# Patient Record
Sex: Female | Born: 1975 | Race: White | Hispanic: No | Marital: Married | Smoking: Current every day smoker
Health system: Southern US, Community
[De-identification: ages and names within clinical notes are randomized; demographics above are authoritative.]

## PROBLEM LIST (undated history)

## (undated) DIAGNOSIS — I10 Essential (primary) hypertension: Secondary | ICD-10-CM

## (undated) DIAGNOSIS — O24419 Gestational diabetes mellitus in pregnancy, unspecified control: Secondary | ICD-10-CM

---

## 2007-11-19 ENCOUNTER — Emergency Department (HOSPITAL_COMMUNITY): Admission: EM | Admit: 2007-11-19 | Discharge: 2007-11-19 | Payer: Self-pay | Admitting: Emergency Medicine

## 2009-09-19 ENCOUNTER — Emergency Department (HOSPITAL_COMMUNITY): Admission: EM | Admit: 2009-09-19 | Discharge: 2009-09-19 | Payer: Self-pay | Admitting: Emergency Medicine

## 2010-09-09 ENCOUNTER — Emergency Department (HOSPITAL_COMMUNITY)
Admission: EM | Admit: 2010-09-09 | Discharge: 2010-09-09 | Disposition: A | Discharge status: Home / Self Care | Payer: Medicaid Other | Attending: Emergency Medicine | Admitting: Emergency Medicine

## 2010-09-09 DIAGNOSIS — O031 Delayed or excessive hemorrhage following incomplete spontaneous abortion: Secondary | ICD-10-CM | POA: Insufficient documentation

## 2010-09-09 LAB — HCG, QUANTITATIVE, PREGNANCY: hCG, Beta Chain, Quant, S: 4575 m[IU]/mL — ABNORMAL HIGH (ref <5)

## 2010-09-09 LAB — CBC
HCT: 37.9 % (ref 36.0–46.0)
Hemoglobin: 13.1 g/dL (ref 12.0–15.0)
MCHC: 34.6 g/dL (ref 30.0–36.0)
MCV: 89.4 fL (ref 78.0–100.0)
RDW: 12.9 % (ref 11.5–15.5)

## 2010-09-09 LAB — DIFFERENTIAL
Basophils Absolute: 0 10*3/uL (ref 0.0–0.1)
Eosinophils Relative: 2 % (ref 0–5)
Lymphocytes Relative: 22 % (ref 12–46)
Lymphs Abs: 1.9 10*3/uL (ref 0.7–4.0)
Monocytes Absolute: 0.5 10*3/uL (ref 0.1–1.0)
Monocytes Relative: 6 % (ref 3–12)
Neutro Abs: 6 10*3/uL (ref 1.7–7.7)

## 2010-09-09 LAB — ABO/RH: ABO/RH(D): A POS

## 2010-09-09 LAB — BASIC METABOLIC PANEL
Chloride: 105 mEq/L (ref 96–112)
Creatinine, Ser: 0.53 mg/dL (ref 0.4–1.2)
Potassium: 3.8 mEq/L (ref 3.5–5.1)

## 2012-06-29 ENCOUNTER — Emergency Department (HOSPITAL_COMMUNITY)
Admission: EM | Admit: 2012-06-29 | Discharge: 2012-06-29 | Disposition: A | Discharge status: Home / Self Care | Payer: Medicaid - Out of State | Attending: Emergency Medicine | Admitting: Emergency Medicine

## 2012-06-29 ENCOUNTER — Encounter (HOSPITAL_COMMUNITY): Payer: Self-pay | Admitting: *Deleted

## 2012-06-29 ENCOUNTER — Emergency Department (HOSPITAL_COMMUNITY): Payer: Medicaid - Out of State

## 2012-06-29 DIAGNOSIS — W19XXXA Unspecified fall, initial encounter: Secondary | ICD-10-CM | POA: Insufficient documentation

## 2012-06-29 DIAGNOSIS — F17200 Nicotine dependence, unspecified, uncomplicated: Secondary | ICD-10-CM | POA: Insufficient documentation

## 2012-06-29 DIAGNOSIS — S7000XA Contusion of unspecified hip, initial encounter: Secondary | ICD-10-CM | POA: Insufficient documentation

## 2012-06-29 DIAGNOSIS — M549 Dorsalgia, unspecified: Secondary | ICD-10-CM

## 2012-06-29 DIAGNOSIS — Y939 Activity, unspecified: Secondary | ICD-10-CM | POA: Insufficient documentation

## 2012-06-29 DIAGNOSIS — IMO0002 Reserved for concepts with insufficient information to code with codable children: Secondary | ICD-10-CM | POA: Insufficient documentation

## 2012-06-29 DIAGNOSIS — I10 Essential (primary) hypertension: Secondary | ICD-10-CM | POA: Insufficient documentation

## 2012-06-29 DIAGNOSIS — Z8632 Personal history of gestational diabetes: Secondary | ICD-10-CM | POA: Insufficient documentation

## 2012-06-29 DIAGNOSIS — Y929 Unspecified place or not applicable: Secondary | ICD-10-CM | POA: Insufficient documentation

## 2012-06-29 HISTORY — DX: Essential (primary) hypertension: I10

## 2012-06-29 HISTORY — DX: Gestational diabetes mellitus in pregnancy, unspecified control: O24.419

## 2012-06-29 MED ORDER — NAPROXEN 500 MG PO TABS: 500.0000 mg | ORAL_TABLET | Freq: Two times a day (BID) | ORAL | Status: DC

## 2012-06-29 MED ORDER — OXYCODONE-ACETAMINOPHEN 5-325 MG PO TABS
1.0000 | ORAL_TABLET | Freq: Once | ORAL | Status: AC
  Administered 2012-06-29: 1 via ORAL
  Filled 2012-06-29: qty 1

## 2012-06-29 MED ORDER — OXYCODONE-ACETAMINOPHEN 5-325 MG PO TABS: 1.0000 | ORAL_TABLET | Freq: Four times a day (QID) | ORAL | Status: AC | PRN

## 2012-06-29 NOTE — ED Notes (Signed)
Pt fell 2 weeks prior, fell again 2 days ago, pt reports pain to tailbone rt hip. Pt ambulated to triage.

## 2012-06-29 NOTE — ED Notes (Signed)
Pt discharged. Pt stable at time of discharge. Medications reviewed pt has no questions regarding discharge at this time. Pt voiced understanding of discharge instructions.  

## 2012-06-29 NOTE — ED Provider Notes (Signed)
History     CSN: [id number]  Arrival date & time 06/29/12  1749   First MD Initiated Contact with Patient 06/29/12 1805      Chief Complaint  Patient presents with  . Hip Pain    rt hip from fall 2 weeks prior  . Tailbone Pain    (Consider location/radiation/quality/duration/timing/severity/associated sxs/prior treatment) Patient is a 36 y.o. female presenting with hip pain. The history is provided by the patient (the pt has fallen twice in the last week on her back.  she has pain in her coccyx and right hip). No language interpreter was used.  Hip Pain This is a new problem. The current episode started more than 2 days ago. The problem occurs constantly. The problem has not changed since onset.Pertinent negatives include no chest pain, no abdominal pain and no headaches. The symptoms are aggravated by bending. Nothing relieves the symptoms. She has tried nothing for the symptoms. The treatment provided no relief.    Past Medical History  Diagnosis Date  . Gestational diabetes   . Hypertension     History reviewed. No pertinent past surgical history.  History reviewed. No pertinent family history.  History  Substance Use Topics  . Smoking status: Current Every Day Smoker -- 1.0 packs/day    Types: Cigarettes  . Smokeless tobacco: Not on file  . Alcohol Use: Yes    OB History    Grav Para Term Preterm Abortions TAB SAB Ect Mult Living                  Review of Systems  Constitutional: Negative for fatigue.  HENT: Negative for congestion, sinus pressure and ear discharge.   Eyes: Negative for discharge.  Respiratory: Negative for cough.   Cardiovascular: Negative for chest pain.  Gastrointestinal: Negative for abdominal pain and diarrhea.  Genitourinary: Negative for frequency and hematuria.  Musculoskeletal: Positive for back pain.  Skin: Negative for rash.  Neurological: Negative for seizures and headaches.  Hematological: Negative.     Psychiatric/Behavioral: Negative for hallucinations.    Allergies  Erythromycin and Morphine and related  Home Medications   Current Outpatient Rx  Name  Route  Sig  Dispense  Refill  . IBUPROFEN 200 MG PO TABS   Oral   Take 600 mg by mouth 2 (two) times daily as needed. For pain         . NAPROXEN 500 MG PO TABS   Oral   Take 1 tablet (500 mg total) by mouth 2 (two) times daily.   30 tablet   0   . OXYCODONE-ACETAMINOPHEN 5-325 MG PO TABS   Oral   Take 1 tablet by mouth every 6 (six) hours as needed for pain.   30 tablet   0     BP 136/94  Pulse 100  Temp 98 F (36.7 C) (Oral)  Resp 20  Ht 5\' 3"  (1.6 m)  Wt 210 lb (95.255 kg)  BMI 37.20 kg/m2  SpO2 99%  LMP 06/27/2012  Physical Exam  Constitutional: She is oriented to person, place, and time. She appears well-developed.  HENT:  Head: Normocephalic and atraumatic.  Eyes: Conjunctivae normal and EOM are normal. No scleral icterus.  Neck: Neck supple. No thyromegaly present.  Cardiovascular: Normal rate and regular rhythm.  Exam reveals no gallop and no friction rub.   No murmur heard. Pulmonary/Chest: No stridor. She has no wheezes. She has no rales. She exhibits no tenderness.  Abdominal: She exhibits no distension. There is  no tenderness. There is no rebound.  Musculoskeletal:       Tender right hip.  Tender coccyx  Lymphadenopathy:    She has no cervical adenopathy.  Neurological: She is oriented to person, place, and time. Coordination normal.  Skin: No rash noted. No erythema.  Psychiatric: She has a normal mood and affect. Her behavior is normal.    ED Course  Procedures (including critical care time)  Labs Reviewed - No data to display Dg Lumbar Spine Complete  06/29/2012  *RADIOLOGY REPORT*  Clinical Data: Fall.  Low back pain.  LUMBAR SPINE - COMPLETE 4+ VIEW  Comparison:  None.  Findings:  There is no evidence of lumbar spine fracture. Alignment is normal.  Intervertebral disc spaces are  maintained.  IMPRESSION: Negative.   Original Report Authenticated By: Myles Rosenthal, M.D.    Dg Sacrum/coccyx  06/29/2012  *RADIOLOGY REPORT*  Clinical Data: Fall.  Sacrococcygeal pain.  SACRUM AND COCCYX - 2+ VIEW  Comparison:  None.  Findings:  There is no evidence of fracture or other focal bone lesions.  IMPRESSION: Negative.   Original Report Authenticated By: Myles Rosenthal, M.D.    Dg Hip Complete Right  06/29/2012  *RADIOLOGY REPORT*  Clinical Data: Fall.  Right hip injury and pain.  RIGHT HIP - COMPLETE 2+ VIEW  Comparison:  None.  Findings:  There is no evidence of hip fracture or dislocation. There is no evidence of arthropathy or other focal bone abnormality.  IMPRESSION: Negative.   Original Report Authenticated By: Myles Rosenthal, M.D.      1. Contusion, hip   2. Back pain       MDM          Benny Lennert, MD 06/29/12 [phone]

## 2013-03-26 ENCOUNTER — Encounter (HOSPITAL_COMMUNITY): Payer: Self-pay | Admitting: Emergency Medicine

## 2013-03-26 ENCOUNTER — Emergency Department (HOSPITAL_COMMUNITY)
Admission: EM | Admit: 2013-03-26 | Discharge: 2013-03-26 | Disposition: A | Discharge status: Home / Self Care | Payer: Medicaid - Out of State | Attending: Emergency Medicine | Admitting: Emergency Medicine

## 2013-03-26 ENCOUNTER — Emergency Department (HOSPITAL_COMMUNITY): Payer: Medicaid - Out of State

## 2013-03-26 DIAGNOSIS — Y939 Activity, unspecified: Secondary | ICD-10-CM | POA: Insufficient documentation

## 2013-03-26 DIAGNOSIS — Z79899 Other long term (current) drug therapy: Secondary | ICD-10-CM | POA: Insufficient documentation

## 2013-03-26 DIAGNOSIS — Z8632 Personal history of gestational diabetes: Secondary | ICD-10-CM | POA: Insufficient documentation

## 2013-03-26 DIAGNOSIS — I10 Essential (primary) hypertension: Secondary | ICD-10-CM | POA: Insufficient documentation

## 2013-03-26 DIAGNOSIS — S8261XA Displaced fracture of lateral malleolus of right fibula, initial encounter for closed fracture: Secondary | ICD-10-CM

## 2013-03-26 DIAGNOSIS — F17200 Nicotine dependence, unspecified, uncomplicated: Secondary | ICD-10-CM | POA: Insufficient documentation

## 2013-03-26 DIAGNOSIS — S8263XA Displaced fracture of lateral malleolus of unspecified fibula, initial encounter for closed fracture: Secondary | ICD-10-CM | POA: Insufficient documentation

## 2013-03-26 DIAGNOSIS — X500XXA Overexertion from strenuous movement or load, initial encounter: Secondary | ICD-10-CM | POA: Insufficient documentation

## 2013-03-26 DIAGNOSIS — Y929 Unspecified place or not applicable: Secondary | ICD-10-CM | POA: Insufficient documentation

## 2013-03-26 MED ORDER — HYDROCODONE-ACETAMINOPHEN 5-325 MG PO TABS
2.0000 | ORAL_TABLET | Freq: Once | ORAL | Status: AC
  Administered 2013-03-26: 2 via ORAL
  Filled 2013-03-26: qty 2

## 2013-03-26 MED ORDER — HYDROCODONE-ACETAMINOPHEN 5-325 MG PO TABS: 2.0000 | ORAL_TABLET | ORAL | Status: DC | PRN

## 2013-03-26 NOTE — ED Notes (Signed)
Tripped an fell last night and right foot rolled. States can not put any weight on it and toes get numb. Nad. Slight swelling noted.

## 2013-03-26 NOTE — ED Notes (Signed)
Pt seen and evaluated by EDP for initial assessment. 

## 2013-03-26 NOTE — ED Notes (Signed)
Pt does not want crutches, has a brand new pair in her car.

## 2013-03-26 NOTE — ED Provider Notes (Signed)
Scribed for [name], the patient was seen in room APFT24/APFT24. This chart was scribed by [name], ED scribe. Patient's care was started at 1542   CSN: [id number]     Arrival date & time 03/26/13  1520 History   First MD Initiated Contact with Patient 03/26/13 1540     Chief Complaint  Patient presents with  . Fall   (Consider location/radiation/quality/duration/timing/severity/associated sxs/prior Treatment) The history is provided by the patient.   HPI Comments: Leslie Bates is a 37 y.o. female who presents to the Emergency Department complaining of worsening constant right ankle pain onset 2330 last night after a mechanical fall causing her to twist right ankle. Reports associated mild numbness in toes, and swelling lateral ankle. Describes pain as sharp and non-radiating. Reports pain is exacerbated by touch, movement, and weight bearing. Denies any alleviating factors. Denies associated neck injury, abdominal pain, head injury, LOC, back pain, and other related injuries.  Past Medical History  Diagnosis Date  . Gestational diabetes   . Hypertension    History reviewed. No pertinent past surgical history. History reviewed. No pertinent family history. History  Substance Use Topics  . Smoking status: Current Every Day Smoker -- 1.00 packs/day    Types: Cigarettes  . Smokeless tobacco: Not on file  . Alcohol Use: Yes   OB History   Grav Para Term Preterm Abortions TAB SAB Ect Mult Living                 Review of Systems  Musculoskeletal: Positive for arthralgias.  Skin: Negative for wound.  All other systems reviewed and are negative.   A complete 10 system review of systems was obtained and all systems are negative except as noted in the HPI and PMH.    Allergies  Erythromycin and Morphine and related  Home Medications   Current Outpatient Rx  Name  Route  Sig  Dispense  Refill  . ibuprofen (ADVIL,MOTRIN) 800 MG tablet   Oral  Take 800 mg by mouth every 8 (eight) hours as needed for pain.         . metFORMIN (GLUCOPHAGE) 500 MG tablet   Oral   Take 500 mg by mouth 2 (two) times daily.         Marland Kitchen HYDROcodone-acetaminophen (NORCO/VICODIN) 5-325 MG per tablet   Oral   Take 2 tablets by mouth every 4 (four) hours as needed for pain.   20 tablet   0    BP 132/82  Pulse 112  Temp(Src) 97.8 F (36.6 C) (Oral)  Resp 16  SpO2 95%  LMP 03/26/2013 Physical Exam  Nursing note and vitals reviewed. Constitutional: She is oriented to person, place, and time. She appears well-developed and well-nourished. No distress.  HENT:  Head: Normocephalic and atraumatic.  Mouth/Throat: Oropharynx is clear and moist. No oropharyngeal exudate.  Eyes: Conjunctivae and EOM are normal. No scleral icterus.  Neck: Normal range of motion. Neck supple. No tracheal deviation present.  Cardiovascular: Normal rate, regular rhythm, intact distal pulses and normal pulses.   No murmur heard. Pulses:      Dorsalis pedis pulses are 2+ on the right side.       Posterior tibial pulses are 2+ on the right side.  Pulmonary/Chest: Effort normal and breath sounds normal. No respiratory distress.  Musculoskeletal: She exhibits edema and tenderness.       Right ankle: She exhibits decreased range of motion (limited flexion and extension secondary to pain ) and  swelling. She exhibits no ecchymosis. Tenderness. Lateral malleolus tenderness found. No medial malleolus and no head of 5th metatarsal tenderness found. Achilles tendon normal. Achilles tendon exhibits no defect.  Right ankle:  small joint effusion. TTP lateral malleolus Achilles intact  +2 PT DP pulse  No proximal fibula tenderness  Neurological: She is alert and oriented to person, place, and time. No sensory deficit.  Normal sensation to light touch   Skin: Skin is warm and dry.  Psychiatric: She has a normal mood and affect. Her behavior is normal.    ED Course  Procedures  (including critical care time) Medications  HYDROcodone-acetaminophen (NORCO/VICODIN) 5-325 MG per tablet 2 tablet (2 tablets Oral Given 03/26/13 1608)    Labs Review Labs Reviewed  GLUCOSE, CAPILLARY - Abnormal; Notable for the following:    Glucose-Capillary 219 (*)    All other components within normal limits   Imaging Review Dg Ankle Complete Right  03/26/2013   CLINICAL DATA:  Initial encounter for left foot and ankle injury due to a fall.  EXAM: RIGHT ANKLE - COMPLETE 3+ VIEW  COMPARISON:  None.  FINDINGS: Tiny avulsion fracture arising from the tip of the lateral malleolus. No other fractures. Ankle mortise intact with well preserved joint space. No visible joint effusion. Dorsal and lateral soft tissue swelling.  IMPRESSION: Tiny avulsion fracture arising from the tip of the lateral malleolus. No other fractures.   Electronically Signed   By: Hulan Saas   On: 03/26/2013 15:45   Dg Foot Complete Right  03/26/2013   CLINICAL DATA:  Initial encounter for left foot and ankle injury due to a fall.  EXAM: RIGHT FOOT COMPLETE - 3+ VIEW  COMPARISON:  None.  FINDINGS: No evidence of acute fracture or dislocation. Well preserved joint spaces. Well preserved bone mineral density. No intrinsic osseous abnormalities. Dorsal soft tissue swelling.  IMPRESSION: No osseous abnormality.   Electronically Signed   By: Hulan Saas   On: 03/26/2013 15:43    MDM   1. Avulsion fracture of lateral malleolus, right, closed, initial encounter    Right lateral ankle pain after mechanical fall last night stepping into a ditch. Did not hit head or lose consciousness. Difficulty bearing weight with lateral right ankle pain.  Skin intact. Distal pulses intact. Tenderness over lateral malleolus. Achilles intact. No proximal fibula tenderness  Avulsion fracture of lateral malleolar tip on Xray. Patient instructed on elevation, ice, pain control.  Cam walker, crutchers, followup with Ortho.  I personally  performed the services described in this documentation, which was scribed in my presence. The recorded information has been reviewed and is accurate.   [name], MD 03/26/13 [phone]

## 2013-06-16 ENCOUNTER — Emergency Department (HOSPITAL_COMMUNITY)
Admission: EM | Admit: 2013-06-16 | Discharge: 2013-06-16 | Disposition: A | Discharge status: Home / Self Care | Payer: Medicaid - Out of State | Attending: Emergency Medicine | Admitting: Emergency Medicine

## 2013-06-16 ENCOUNTER — Encounter (HOSPITAL_COMMUNITY): Payer: Self-pay | Admitting: Emergency Medicine

## 2013-06-16 DIAGNOSIS — Z8632 Personal history of gestational diabetes: Secondary | ICD-10-CM | POA: Insufficient documentation

## 2013-06-16 DIAGNOSIS — R110 Nausea: Secondary | ICD-10-CM | POA: Insufficient documentation

## 2013-06-16 DIAGNOSIS — Z79899 Other long term (current) drug therapy: Secondary | ICD-10-CM | POA: Insufficient documentation

## 2013-06-16 DIAGNOSIS — R109 Unspecified abdominal pain: Secondary | ICD-10-CM

## 2013-06-16 DIAGNOSIS — F17200 Nicotine dependence, unspecified, uncomplicated: Secondary | ICD-10-CM | POA: Insufficient documentation

## 2013-06-16 DIAGNOSIS — R1012 Left upper quadrant pain: Secondary | ICD-10-CM | POA: Insufficient documentation

## 2013-06-16 DIAGNOSIS — R141 Gas pain: Secondary | ICD-10-CM | POA: Insufficient documentation

## 2013-06-16 DIAGNOSIS — R142 Eructation: Secondary | ICD-10-CM | POA: Insufficient documentation

## 2013-06-16 DIAGNOSIS — Z3202 Encounter for pregnancy test, result negative: Secondary | ICD-10-CM | POA: Insufficient documentation

## 2013-06-16 DIAGNOSIS — I10 Essential (primary) hypertension: Secondary | ICD-10-CM | POA: Insufficient documentation

## 2013-06-16 LAB — CBC WITH DIFFERENTIAL/PLATELET
Basophils Absolute: 0 10*3/uL (ref 0.0–0.1)
Basophils Relative: 0 % (ref 0–1)
Eosinophils Absolute: 0.2 10*3/uL (ref 0.0–0.7)
Lymphs Abs: 2.3 10*3/uL (ref 0.7–4.0)
MCH: 31.9 pg (ref 26.0–34.0)
MCHC: 34.6 g/dL (ref 30.0–36.0)
Neutrophils Relative %: 72 % (ref 43–77)
Platelets: 180 10*3/uL (ref 150–400)
RBC: 4.74 MIL/uL (ref 3.87–5.11)
RDW: 13.1 % (ref 11.5–15.5)

## 2013-06-16 LAB — URINALYSIS, ROUTINE W REFLEX MICROSCOPIC
Bilirubin Urine: NEGATIVE
Hgb urine dipstick: NEGATIVE
Leukocytes, UA: NEGATIVE
Nitrite: NEGATIVE
Specific Gravity, Urine: 1.02 (ref 1.005–1.030)
pH: 7 (ref 5.0–8.0)

## 2013-06-16 LAB — BASIC METABOLIC PANEL
GFR calc Af Amer: >90 mL/min (ref >90)
GFR calc non Af Amer: >90 mL/min (ref >90)
Potassium: 3.7 mEq/L (ref 3.5–5.1)
Sodium: 136 mEq/L (ref 135–145)

## 2013-06-16 LAB — HEPATIC FUNCTION PANEL
AST: 48 U/L — ABNORMAL HIGH (ref 0–37)
Albumin: 3.4 g/dL — ABNORMAL LOW (ref 3.5–5.2)
Total Protein: 7.4 g/dL (ref 6.0–8.3)

## 2013-06-16 LAB — LIPASE, BLOOD: Lipase: 23 U/L (ref 11–59)

## 2013-06-16 LAB — POCT PREGNANCY, URINE: Preg Test, Ur: NEGATIVE

## 2013-06-16 MED ORDER — HYDROCODONE-ACETAMINOPHEN 5-325 MG PO TABS
2.0000 | ORAL_TABLET | Freq: Once | ORAL | Status: AC
  Administered 2013-06-16: 2 via ORAL
  Filled 2013-06-16: qty 2

## 2013-06-16 MED ORDER — FAMOTIDINE 20 MG PO TABS
20.0000 mg | ORAL_TABLET | Freq: Once | ORAL | Status: AC
  Administered 2013-06-16: 20 mg via ORAL
  Filled 2013-06-16: qty 1

## 2013-06-16 MED ORDER — FENTANYL CITRATE 0.05 MG/ML IJ SOLN
100.0000 ug | Freq: Once | INTRAMUSCULAR | Status: AC
  Administered 2013-06-16: 100 ug via INTRAMUSCULAR
  Filled 2013-06-16: qty 2

## 2013-06-16 MED ORDER — PROMETHAZINE HCL 25 MG PO TABS: 25.0000 mg | ORAL_TABLET | Freq: Four times a day (QID) | ORAL | Status: DC | PRN

## 2013-06-16 MED ORDER — HYDROCODONE-ACETAMINOPHEN 5-325 MG PO TABS: ORAL_TABLET | ORAL | Status: DC

## 2013-06-16 MED ORDER — PROMETHAZINE HCL 12.5 MG PO TABS
12.5000 mg | ORAL_TABLET | Freq: Once | ORAL | Status: AC
  Administered 2013-06-16: 12.5 mg via ORAL
  Filled 2013-06-16: qty 1

## 2013-06-16 MED ORDER — ONDANSETRON 4 MG PO TBDP
4.0000 mg | ORAL_TABLET | Freq: Once | ORAL | Status: AC
  Administered 2013-06-16: 4 mg via ORAL
  Filled 2013-06-16: qty 1

## 2013-06-16 MED ORDER — RANITIDINE HCL 150 MG PO TABS: 150.0000 mg | ORAL_TABLET | Freq: Two times a day (BID) | ORAL | Status: AC

## 2013-06-16 MED ORDER — OMEPRAZOLE 20 MG PO CPDR: 20.0000 mg | DELAYED_RELEASE_CAPSULE | Freq: Every day | ORAL | Status: DC

## 2013-06-16 NOTE — ED Provider Notes (Signed)
CSN: [id number]     Arrival date & time 06/16/13  1817 History   First MD Initiated Contact with Patient 06/16/13 1846     Chief Complaint  Patient presents with  . Abdominal Pain   (Consider location/radiation/quality/duration/timing/severity/associated sxs/prior Treatment) HPI Comments: Patient states she's been having upper abdomen pain for about 3 days. She's had nausea, and cramping, but no vomiting. The patient also states that she's been having a" discomfort" over the bladder area". The patient states she's not been paying a lot of attention to what she's been eating particularly over the last week, but did notice that approximately 2-3 hours after eating a sausage biscuit today the pain seemed to have gotten progressively worse. The patient states her bowels have been semisolid. She's not had any problems with constipation recently. She denies any blood in the stool, or blood in the urine.  Patient is a 37 y.o. female presenting with abdominal pain. The history is provided by the patient.  Abdominal Pain Pain location:  LUQ Pain quality: cramping and sharp   Pain radiates to:  RUQ Pain severity:  Moderate Onset quality:  Gradual Duration:  3 days Timing:  Intermittent Progression:  Worsening Chronicity:  New Context: alcohol use   Context: not eating, not recent travel and not trauma   Relieved by:  Nothing Worsened by:  Movement and deep breathing Ineffective treatments:  None tried Associated symptoms: belching and nausea   Associated symptoms: no anorexia, no chest pain, no constipation, no cough, no dysuria, no hematuria, no shortness of breath, no vaginal bleeding, no vaginal discharge and no vomiting   Risk factors: obesity     Past Medical History  Diagnosis Date  . Gestational diabetes   . Hypertension    Past Surgical History  Procedure Laterality Date  . Cesarean section     History reviewed. No pertinent family history. History  Substance Use Topics  .  Smoking status: Current Every Day Smoker -- 1.00 packs/day    Types: Cigarettes  . Smokeless tobacco: Not on file  . Alcohol Use: Yes   OB History   Grav Para Term Preterm Abortions TAB SAB Ect Mult Living                 Review of Systems  Constitutional: Negative for activity change.       All ROS Neg except as noted in HPI  HENT: Negative for nosebleeds.   Eyes: Negative for photophobia and discharge.  Respiratory: Negative for cough, shortness of breath and wheezing.   Cardiovascular: Negative for chest pain and palpitations.  Gastrointestinal: Positive for nausea and abdominal pain. Negative for vomiting, constipation, blood in stool and anorexia.  Genitourinary: Negative for dysuria, frequency, hematuria, vaginal bleeding and vaginal discharge.  Musculoskeletal: Negative for arthralgias, back pain and neck pain.  Skin: Negative.   Neurological: Negative for dizziness, seizures and speech difficulty.  Psychiatric/Behavioral: Negative for hallucinations and confusion.    Allergies  Erythromycin and Morphine and related  Home Medications   Current Outpatient Rx  Name  Route  Sig  Dispense  Refill  . HYDROcodone-acetaminophen (NORCO/VICODIN) 5-325 MG per tablet   Oral   Take 2 tablets by mouth every 4 (four) hours as needed for pain.   20 tablet   0   . ibuprofen (ADVIL,MOTRIN) 800 MG tablet   Oral   Take 800 mg by mouth every 8 (eight) hours as needed for pain.         . metFORMIN (GLUCOPHAGE)  500 MG tablet   Oral   Take 500 mg by mouth 2 (two) times daily.          BP 132/74  Pulse 95  Temp(Src) 98.5 F (36.9 C) (Oral)  Resp 18  Ht 5\' 3"  (1.6 m)  Wt 210 lb (95.255 kg)  BMI 37.21 kg/m2  SpO2 96%  LMP 05/16/2013 Physical Exam  Nursing note and vitals reviewed. Constitutional: She is oriented to person, place, and time. She appears well-developed and well-nourished.  Non-toxic appearance.  HENT:  Head: Normocephalic.  Right Ear: Tympanic membrane and  external ear normal.  Left Ear: Tympanic membrane and external ear normal.  Eyes: EOM and lids are normal. Pupils are equal, round, and reactive to light.  Neck: Normal range of motion. Neck supple. Carotid bruit is not present.  Cardiovascular: Normal rate, regular rhythm, normal heart sounds, intact distal pulses and normal pulses.   Pulmonary/Chest: Breath sounds normal. No respiratory distress.  Abdominal: Soft. Bowel sounds are normal. She exhibits no mass. There is no tenderness. There is no rebound and no guarding.  There is pain to palpation of the right upper quadrant in the epigastric area. There is pain to palpation over the suprapubic area. There is no rebound tenderness. And no guarding. No mass appreciated. No organomegaly appreciated. Chaperone present during the examination.  Musculoskeletal: Normal range of motion.  Lymphadenopathy:       Head (right side): No submandibular adenopathy present.       Head (left side): No submandibular adenopathy present.    She has no cervical adenopathy.  Neurological: She is alert and oriented to person, place, and time. She has normal strength. No cranial nerve deficit or sensory deficit.  Skin: Skin is warm and dry.  Psychiatric: She has a normal mood and affect. Her speech is normal.    ED Course  Procedures (including critical care time) Labs Review Labs Reviewed - No data to display Imaging Review No results found.  EKG Interpretation   None       MDM  No diagnosis found. **I have reviewed nursing notes, vital signs, and all appropriate lab and imaging results for this patient.*  Urinalysis reveals a cloudy specimen that was otherwise within normal limits. Lipase was normal at 23. Urine pregnancy test is negative. Complete blood count shows a slight elevation in the white blood cells of 10,700, hemoglobin 15.1, otherwise well within normal limits. The basic metabolic panel was well within normal limits. The hepatic function  reveals the AST to be elevated at 48, the ALT elevated at 97, and the total bili, rule of low at 0.2.  Patient's discomfort improved with intramuscular fentanyl and ODT Zofran. Patient scheduled for right upper quadrant ultrasound on tomorrow morning December 19. If this is positive, the patient has been given a referral to Dr. Lovell Sheehan. If this is negative, the patient is asked to see the GI specialist for additional evaluation. A prescription for promethazine 25 mg, Zantac 150 mg, prostate 20 mg, and Norco 5 mg bid given to the patient.  Kathie Dike, PA-C 06/17/13 [phone]

## 2013-06-16 NOTE — ED Notes (Signed)
Upper abd pain for 3 days, nausea, no vomiting.  Sl diarrhea.  Headache,  No fever.

## 2013-06-17 ENCOUNTER — Ambulatory Visit (HOSPITAL_COMMUNITY)
Admit: 2013-06-17 | Discharge: 2013-06-17 | Disposition: A | Discharge status: Home / Self Care | Payer: Medicaid - Out of State | Attending: Emergency Medicine | Admitting: Emergency Medicine

## 2013-06-17 DIAGNOSIS — K7689 Other specified diseases of liver: Secondary | ICD-10-CM | POA: Insufficient documentation

## 2013-06-17 DIAGNOSIS — R109 Unspecified abdominal pain: Secondary | ICD-10-CM | POA: Insufficient documentation

## 2013-06-18 NOTE — ED Provider Notes (Signed)
Medical screening examination/treatment/procedure(s) were performed by non-physician practitioner and as supervising physician I was immediately available for consultation/collaboration.   Nelia Shi, MD 06/18/13 1155

## 2015-07-16 ENCOUNTER — Encounter (HOSPITAL_COMMUNITY): Payer: Self-pay | Admitting: Emergency Medicine

## 2015-07-16 ENCOUNTER — Emergency Department (HOSPITAL_COMMUNITY)
Admission: EM | Admit: 2015-07-16 | Discharge: 2015-07-16 | Disposition: A | Discharge status: Home / Self Care | Payer: Medicaid - Out of State | Attending: Emergency Medicine | Admitting: Emergency Medicine

## 2015-07-16 ENCOUNTER — Emergency Department (HOSPITAL_COMMUNITY): Payer: Medicaid - Out of State

## 2015-07-16 DIAGNOSIS — J069 Acute upper respiratory infection, unspecified: Secondary | ICD-10-CM | POA: Insufficient documentation

## 2015-07-16 DIAGNOSIS — F17210 Nicotine dependence, cigarettes, uncomplicated: Secondary | ICD-10-CM | POA: Insufficient documentation

## 2015-07-16 DIAGNOSIS — Z8632 Personal history of gestational diabetes: Secondary | ICD-10-CM | POA: Insufficient documentation

## 2015-07-16 DIAGNOSIS — J209 Acute bronchitis, unspecified: Secondary | ICD-10-CM | POA: Insufficient documentation

## 2015-07-16 DIAGNOSIS — R000 Tachycardia, unspecified: Secondary | ICD-10-CM | POA: Insufficient documentation

## 2015-07-16 DIAGNOSIS — Z7984 Long term (current) use of oral hypoglycemic drugs: Secondary | ICD-10-CM | POA: Insufficient documentation

## 2015-07-16 DIAGNOSIS — Z79899 Other long term (current) drug therapy: Secondary | ICD-10-CM | POA: Insufficient documentation

## 2015-07-16 DIAGNOSIS — I10 Essential (primary) hypertension: Secondary | ICD-10-CM | POA: Insufficient documentation

## 2015-07-16 MED ORDER — DOXYCYCLINE HYCLATE 100 MG PO CAPS: 100.0000 mg | ORAL_CAPSULE | Freq: Two times a day (BID) | ORAL | Status: DC

## 2015-07-16 MED ORDER — PREDNISONE 50 MG PO TABS
60.0000 mg | ORAL_TABLET | Freq: Once | ORAL | Status: AC
  Administered 2015-07-16: 60 mg via ORAL
  Filled 2015-07-16: qty 1

## 2015-07-16 MED ORDER — DOXYCYCLINE HYCLATE 100 MG PO TABS
100.0000 mg | ORAL_TABLET | Freq: Once | ORAL | Status: AC
  Administered 2015-07-16: 100 mg via ORAL
  Filled 2015-07-16: qty 1

## 2015-07-16 MED ORDER — HYDROCODONE-HOMATROPINE 5-1.5 MG/5ML PO SYRP
5.0000 mL | ORAL_SOLUTION | Freq: Four times a day (QID) | ORAL | Status: DC | PRN
Start: 2015-07-16 — End: 2017-05-29

## 2015-07-16 MED ORDER — LORATADINE-PSEUDOEPHEDRINE ER 5-120 MG PO TB12: 1.0000 | ORAL_TABLET | Freq: Two times a day (BID) | ORAL | Status: DC

## 2015-07-16 MED ORDER — ONDANSETRON HCL 4 MG PO TABS
4.0000 mg | ORAL_TABLET | Freq: Once | ORAL | Status: AC
  Administered 2015-07-16: 4 mg via ORAL
  Filled 2015-07-16: qty 1

## 2015-07-16 MED ORDER — HYDROCOD POLST-CPM POLST ER 10-8 MG/5ML PO SUER
5.0000 mL | Freq: Once | ORAL | Status: AC
  Administered 2015-07-16: 5 mL via ORAL
  Filled 2015-07-16: qty 5

## 2015-07-16 NOTE — ED Notes (Signed)
Patient complaining of cough and shortness of breath x 2 weeks.

## 2015-07-16 NOTE — ED Provider Notes (Signed)
CSN: [id number]     Arrival date & time 07/16/15  1924 History   First MD Initiated Contact with Patient 07/16/15 2112     Chief Complaint  Patient presents with  . Cough  . Shortness of Breath     (Consider location/radiation/quality/duration/timing/severity/associated sxs/prior Treatment) HPI Comments: Patient is a 40 year old female who presents to the emergency department with a complaint of cough and shortness of breath.  The patient states that beginning approximately January 1 she began having problems with cough and shortness of breath. She states that she had been treated in the past with an albuterol inhaler, and has been using the albuterol. She states in spite of the albuterol she still feels as though she cannot get much area in. Patient also complains of chest wall soreness from somewhat shallow coughing. She does not recall any high fever. She is not coughing up any blood. She has not had any high fevers that she is aware of. She has had body aches. She presents now for assistance with this issue.  The history is provided by the patient.    Past Medical History  Diagnosis Date  . Gestational diabetes   . Hypertension    Past Surgical History  Procedure Laterality Date  . Cesarean section     History reviewed. No pertinent family history. Social History  Substance Use Topics  . Smoking status: Current Every Day Smoker -- 1.00 packs/day    Types: Cigarettes  . Smokeless tobacco: None  . Alcohol Use: No   OB History    No data available     Review of Systems  HENT: Positive for congestion.   Respiratory: Positive for cough and shortness of breath.   All other systems reviewed and are negative.     Allergies  Erythromycin and Morphine and related  Home Medications   Prior to Admission medications   Medication Sig Start Date End Date Taking? Authorizing Provider  HYDROcodone-acetaminophen (NORCO/VICODIN) 5-325 MG per tablet 1 or 2 po q4h prn pain  06/16/13   Ivery Quale, PA-C  ibuprofen (ADVIL,MOTRIN) 200 MG tablet Take 800 mg by mouth every 6 (six) hours as needed.    Historical Provider, MD  lisinopril-hydrochlorothiazide (PRINZIDE,ZESTORETIC) 20-12.5 MG per tablet Take 1 tablet by mouth daily.    Historical Provider, MD  metFORMIN (GLUCOPHAGE) 500 MG tablet Take 500 mg by mouth daily.     Historical Provider, MD  omeprazole (PRILOSEC) 20 MG capsule Take 1 capsule (20 mg total) by mouth daily. 06/16/13   Ivery Quale, PA-C  promethazine (PHENERGAN) 25 MG tablet Take 1 tablet (25 mg total) by mouth every 6 (six) hours as needed for nausea or vomiting. 06/16/13   Ivery Quale, PA-C  ranitidine (ZANTAC) 150 MG tablet Take 1 tablet (150 mg total) by mouth 2 (two) times daily. 06/16/13   Ivery Quale, PA-C   BP 132/72 mmHg  Pulse 112  Temp(Src) 98.2 F (36.8 C) (Oral)  Resp 20  Ht 5\' 3"  (1.6 m)  Wt 99.791 kg  BMI 38.98 kg/m2  SpO2 99%  LMP 06/25/2015 Physical Exam  Constitutional: She is oriented to person, place, and time. She appears well-developed and well-nourished.  Non-toxic appearance.  HENT:  Head: Normocephalic.  Right Ear: Tympanic membrane and external ear normal.  Left Ear: Tympanic membrane and external ear normal.  Nasal congestion present.  Eyes: EOM and lids are normal. Pupils are equal, round, and reactive to light.  Neck: Normal range of motion. Neck supple. Carotid bruit is  not present.  Cardiovascular: Normal rate, regular rhythm, normal heart sounds, intact distal pulses and normal pulses.   Pulmonary/Chest: Breath sounds normal. No respiratory distress.  Course breath sounds present with few scattered rhonchi. Symmetrical rise and fall of the chest. Patient speaks in complete sentences.  Abdominal: Soft. Bowel sounds are normal. There is no tenderness. There is no guarding.  Musculoskeletal: Normal range of motion.  Lymphadenopathy:       Head (right side): No submandibular adenopathy present.        Head (left side): No submandibular adenopathy present.    She has no cervical adenopathy.  Neurological: She is alert and oriented to person, place, and time. She has normal strength. No cranial nerve deficit or sensory deficit.  Skin: Skin is warm and dry. No rash noted.  Psychiatric: She has a normal mood and affect. Her speech is normal.  Nursing note and vitals reviewed.   ED Course  Procedures (including critical care time) Labs Review Labs Reviewed - No data to display  Imaging Review Dg Chest 2 View  07/16/2015  CLINICAL DATA:  40 year old female with history productive cough and shortness of breath for the past 2 weeks. EXAM: CHEST  2 VIEW COMPARISON:  No priors. FINDINGS: Diffuse peribronchial cuffing. Lung volumes are normal. No consolidative airspace disease. No pleural effusions. No pneumothorax. No pulmonary nodule or mass noted. Pulmonary vasculature and the cardiomediastinal silhouette are within normal limits. IMPRESSION: 1. Diffuse peribronchial cuffing, suggestive of an acute bronchitis. Electronically Signed   By: Trudie Reedaniel  Entrikin M.D.   On: 07/16/2015 20:31   I have personally reviewed and evaluated these images and lab results as part of my medical decision-making.   EKG Interpretation None      MDM  Patient has some tachycardia of 112 beats a minute, otherwise vital signs are well within normal limits. The pulse oximetry is 99% on room air.  Chest x-ray shows diffuse peribronchial cuffing, suggestive of acute bronchitis. The patient is a current smoker. The plan at this time is for the patient be treated with doxycycline, Decadron, Claritin-D, and Hycodan. The patient is to follow-up with her primary physician in the week for recheck of her symptoms.    Final diagnoses:  None    *I have reviewed nursing notes, vital signs, and all appropriate lab and imaging results for this patient.**    Ivery QualeHobson Twylla Arceneaux, PA-C 07/16/15 2133  Eber HongBrian Miller, MD 07/21/15  [phone]

## 2015-07-16 NOTE — Discharge Instructions (Signed)
Your x-ray and your exam are consistent with bronchitis. Please increase fluids. Please use your mask until symptoms have resolved. Please stop smoking. Please use Claritin-D, doxycycline, and albuterol daily. Use Hycodan for cough every 6 hours if needed. Please see your Medicaid access physician for recheck in the next 7-10 days. Acute Bronchitis Bronchitis is when the airways that extend from the windpipe into the lungs get red, puffy, and painful (inflamed). Bronchitis often causes thick spit (mucus) to develop. This leads to a cough. A cough is the most common symptom of bronchitis. In acute bronchitis, the condition usually begins suddenly and goes away over time (usually in 2 weeks). Smoking, allergies, and asthma can make bronchitis worse. Repeated episodes of bronchitis may cause more lung problems. HOME CARE  Rest.  Drink enough fluids to keep your pee (urine) clear or pale yellow (unless you need to limit fluids as told by your doctor).  Only take over-the-counter or prescription medicines as told by your doctor.  Avoid smoking and secondhand smoke. These can make bronchitis worse. If you are a smoker, think about using nicotine gum or skin patches. Quitting smoking will help your lungs heal faster.  Reduce the chance of getting bronchitis again by:  Washing your hands often.  Avoiding people with cold symptoms.  Trying not to touch your hands to your mouth, nose, or eyes.  Follow up with your doctor as told. GET HELP IF: Your symptoms do not improve after 1 week of treatment. Symptoms include:  Cough.  Fever.  Coughing up thick spit.  Body aches.  Chest congestion.  Chills.  Shortness of breath.  Sore throat. GET HELP RIGHT AWAY IF:   You have an increased fever.  You have chills.  You have severe shortness of breath.  You have bloody thick spit (sputum).  You throw up (vomit) often.  You lose too much body fluid (dehydration).  You have a severe  headache.  You faint. MAKE SURE YOU:   Understand these instructions.  Will watch your condition.  Will get help right away if you are not doing well or get worse.   This information is not intended to replace advice given to you by your health care provider. Make sure you discuss any questions you have with your health care provider.   Document Released: 12/03/2007 Document Revised: 02/16/2013 Document Reviewed: 12/07/2012 Elsevier Interactive Patient Education [employer]2016 Elsevier Inc.

## 2016-10-15 ENCOUNTER — Encounter (HOSPITAL_COMMUNITY): Payer: Self-pay | Admitting: *Deleted

## 2016-10-15 ENCOUNTER — Emergency Department (HOSPITAL_COMMUNITY)
Admission: EM | Admit: 2016-10-15 | Discharge: 2016-10-16 | Disposition: A | Discharge status: Home / Self Care | Payer: Self-pay | Attending: Emergency Medicine | Admitting: Emergency Medicine

## 2016-10-15 ENCOUNTER — Emergency Department (HOSPITAL_COMMUNITY): Payer: Self-pay

## 2016-10-15 DIAGNOSIS — I10 Essential (primary) hypertension: Secondary | ICD-10-CM | POA: Insufficient documentation

## 2016-10-15 DIAGNOSIS — Z79899 Other long term (current) drug therapy: Secondary | ICD-10-CM | POA: Insufficient documentation

## 2016-10-15 DIAGNOSIS — E119 Type 2 diabetes mellitus without complications: Secondary | ICD-10-CM | POA: Insufficient documentation

## 2016-10-15 DIAGNOSIS — L0390 Cellulitis, unspecified: Secondary | ICD-10-CM

## 2016-10-15 DIAGNOSIS — Z7984 Long term (current) use of oral hypoglycemic drugs: Secondary | ICD-10-CM | POA: Insufficient documentation

## 2016-10-15 DIAGNOSIS — N3289 Other specified disorders of bladder: Secondary | ICD-10-CM | POA: Insufficient documentation

## 2016-10-15 DIAGNOSIS — F17210 Nicotine dependence, cigarettes, uncomplicated: Secondary | ICD-10-CM | POA: Insufficient documentation

## 2016-10-15 DIAGNOSIS — L03317 Cellulitis of buttock: Secondary | ICD-10-CM | POA: Insufficient documentation

## 2016-10-15 LAB — BASIC METABOLIC PANEL
ANION GAP: 8 (ref 5–15)
BUN: 6 mg/dL (ref 6–20)
CHLORIDE: 96 mmol/L — AB (ref 101–111)
CO2: 26 mmol/L (ref 22–32)
Calcium: 8.6 mg/dL — ABNORMAL LOW (ref 8.9–10.3)
Creatinine, Ser: 0.42 mg/dL — ABNORMAL LOW (ref 0.44–1.00)
GFR calc Af Amer: >60 mL/min (ref >60)
GFR calc non Af Amer: >60 mL/min (ref >60)
GLUCOSE: 324 mg/dL — AB (ref 65–99)
Potassium: 3.3 mmol/L — ABNORMAL LOW (ref 3.5–5.1)
Sodium: 130 mmol/L — ABNORMAL LOW (ref 135–145)

## 2016-10-15 LAB — CBC WITH DIFFERENTIAL/PLATELET
BASOS ABS: 0 10*3/uL (ref 0.0–0.1)
Basophils Relative: 0 %
Eosinophils Absolute: 0.1 10*3/uL (ref 0.0–0.7)
Eosinophils Relative: 1 %
HCT: 42.6 % (ref 36.0–46.0)
Hemoglobin: 15 g/dL (ref 12.0–15.0)
LYMPHS PCT: 21 %
Lymphs Abs: 2.6 10*3/uL (ref 0.7–4.0)
MCH: 30.7 pg (ref 26.0–34.0)
MCHC: 35.2 g/dL (ref 30.0–36.0)
MCV: 87.3 fL (ref 78.0–100.0)
Monocytes Absolute: 0.6 10*3/uL (ref 0.1–1.0)
Monocytes Relative: 5 %
NEUTROS ABS: 9.2 10*3/uL — AB (ref 1.7–7.7)
Neutrophils Relative %: 73 %
Platelets: 171 10*3/uL (ref 150–400)
RBC: 4.88 MIL/uL (ref 3.87–5.11)
RDW: 13 % (ref 11.5–15.5)
WBC: 12.6 10*3/uL — ABNORMAL HIGH (ref 4.0–10.5)

## 2016-10-15 LAB — I-STAT BETA HCG BLOOD, ED (MC, WL, AP ONLY): I-stat hCG, quantitative: <5 m[IU]/mL (ref <5)

## 2016-10-15 MED ORDER — IOPAMIDOL (ISOVUE-300) INJECTION 61%
INTRAVENOUS | Status: AC
  Administered 2016-10-15: 30 mL
  Filled 2016-10-15: qty 30

## 2016-10-15 MED ORDER — IOPAMIDOL (ISOVUE-300) INJECTION 61%
100.0000 mL | Freq: Once | INTRAVENOUS | Status: AC | PRN
  Administered 2016-10-15: 100 mL via INTRAVENOUS

## 2016-10-15 MED ORDER — FENTANYL CITRATE (PF) 100 MCG/2ML IJ SOLN
100.0000 ug | Freq: Once | INTRAMUSCULAR | Status: AC
  Administered 2016-10-15: 100 ug via INTRAVENOUS
  Filled 2016-10-15: qty 2

## 2016-10-15 NOTE — ED Notes (Signed)
Pt states that she has ran out of her blood pressure medication, has appointment to get prescription refilled,

## 2016-10-15 NOTE — ED Triage Notes (Signed)
Pt c/o ?abscess to right buttock region that started a few days ago,

## 2016-10-15 NOTE — ED Provider Notes (Signed)
[facility]EM[facility] DEPT Provider Note   CSN: [id number] Arrival date & time: 10/15/16  1946     History   Chief Complaint Chief Complaint  Patient presents with  . Abscess    HPI Leslie Bates is a 41 y.o. female who presents with 1 week of worsening pain, swelling and redness to her right buttocks. Patient reports that initially area started very small but it has grown significantly over the past week. She reports that pain is worsened with sitting down or movement. She has tried ibuprofen, Epsom salts and warm baths with no relief. She has tried to drain the area but denies any drainage from the site. She denies any injuries or trauma. She has a history of Diabetes and states that she is prescribed Metformin but has not taken it in 3 months. She denies any fevers, CP, SOB, any other concerns.   The history is provided by the patient.    Past Medical History:  Diagnosis Date  . Gestational diabetes   . Hypertension     There are no active problems to display for this patient.   Past Surgical History:  Procedure Laterality Date  . CESAREAN SECTION      OB History    No data available       Home Medications    Prior to Admission medications   Medication Sig Start Date End Date Taking? Authorizing Provider  doxycycline (VIBRAMYCIN) 100 MG capsule Take 1 capsule (100 mg total) by mouth 2 (two) times daily. 07/16/15   Ivery Quale, PA-C  HYDROcodone-acetaminophen (NORCO/VICODIN) 5-325 MG tablet Take 1-2 tablets by mouth every 4 (four) hours as needed. 10/16/16   Maxwell Caul, PA-C  HYDROcodone-homatropine (HYCODAN) 5-1.5 MG/5ML syrup Take 5 mLs by mouth every 6 (six) hours as needed. 07/16/15   Ivery Quale, PA-C  ibuprofen (ADVIL,MOTRIN) 200 MG tablet Take 800 mg by mouth every 6 (six) hours as needed.    Historical Provider, MD  lisinopril-hydrochlorothiazide (PRINZIDE,ZESTORETIC) 20-12.5 MG per tablet Take 1 tablet by mouth daily.    Historical Provider, MD    loratadine-pseudoephedrine (CLARITIN-D 12 HOUR) 5-120 MG tablet Take 1 tablet by mouth 2 (two) times daily. 07/16/15   Ivery Quale, PA-C  metFORMIN (GLUCOPHAGE) 500 MG tablet Take 500 mg by mouth daily.     Historical Provider, MD  omeprazole (PRILOSEC) 20 MG capsule Take 1 capsule (20 mg total) by mouth daily. 06/16/13   Ivery Quale, PA-C  promethazine (PHENERGAN) 25 MG tablet Take 1 tablet (25 mg total) by mouth every 6 (six) hours as needed for nausea or vomiting. 06/16/13   Ivery Quale, PA-C  ranitidine (ZANTAC) 150 MG tablet Take 1 tablet (150 mg total) by mouth 2 (two) times daily. 06/16/13   Ivery Quale, PA-C  sulfamethoxazole-trimethoprim (BACTRIM DS,SEPTRA DS) 800-160 MG tablet Take 1 tablet by mouth 2 (two) times daily. 10/16/16 10/26/16  Maxwell Caul, PA-C    Family History No family history on file.  Social History Social History  Substance Use Topics  . Smoking status: Current Every Day Smoker    Packs/day: 1.00    Types: Cigarettes  . Smokeless tobacco: Never Used  . Alcohol use No     Allergies   Erythromycin and Morphine and related   Review of Systems Review of Systems  Constitutional: Negative for fever.  Respiratory: Negative for cough and shortness of breath.   Cardiovascular: Negative for chest pain.  Gastrointestinal: Negative for abdominal pain, diarrhea, nausea and vomiting.  Genitourinary: Negative  for dysuria and hematuria.  Musculoskeletal: Negative for back pain.  Skin: Positive for color change.       +Swelling and redness to right buttocks  Neurological: Negative for headaches.  All other systems reviewed and are negative.    Physical Exam Updated Vital Signs BP 129/72 (BP Location: Right Arm)   Pulse 100   Temp 98.5 F (36.9 C) (Oral)   Resp 19   Ht  (1.6 m)   Wt 83.9 kg   LMP 09/30/2016   SpO2 97%   BMI 32.77 kg/m   Physical Exam  Constitutional: She appears well-developed and well-nourished.  HENT:  Head:  Normocephalic and atraumatic.  Eyes: Conjunctivae and EOM are normal. Right eye exhibits no discharge. Left eye exhibits no discharge. No scleral icterus.  Cardiovascular: Normal rate, regular rhythm and intact distal pulses.   Pulmonary/Chest: Effort normal and breath sounds normal.  Genitourinary: Rectum normal. Rectal exam shows no mass and no tenderness.  Genitourinary Comments: The exam was performed with a chaperone present. No abscess, no area of fluctuance or induration noted on rectal exam.   Musculoskeletal: She exhibits no deformity.  Neurological: She is alert.  Skin: Skin is warm and dry.  Large area of erythema and  induration to the medial aspect of the gluteus that extends to the gluteal fold. No anal or rectal involvement noted. No fluctuance noted. No active drainage.  Psychiatric: She has a normal mood and affect. Her speech is normal and behavior is normal.     ED Treatments / Results  Labs (all labs ordered are listed, but only abnormal results are displayed) Labs Reviewed  BASIC METABOLIC PANEL - Abnormal; Notable for the following:       Result Value   Sodium 130 (*)    Potassium 3.3 (*)    Chloride 96 (*)    Glucose, Bld 324 (*)    Creatinine, Ser 0.42 (*)    Calcium 8.6 (*)    All other components within normal limits  CBC WITH DIFFERENTIAL/PLATELET - Abnormal; Notable for the following:    WBC 12.6 (*)    Neutro Abs 9.2 (*)    All other components within normal limits  I-STAT BETA HCG BLOOD, ED ([facility], [facility], [facility] ONLY)    EKG  EKG Interpretation None       Radiology Ct Abdomen Pelvis W Contrast  Result Date: 10/15/2016 CLINICAL DATA:  Right perirectal abscess. EXAM: CT ABDOMEN AND PELVIS WITH CONTRAST TECHNIQUE: Multidetector CT imaging of the abdomen and pelvis was performed using the standard protocol following bolus administration of intravenous contrast. CONTRAST:  100 cc Isovue-300 IV COMPARISON:  None. FINDINGS: Lower chest: Thin-walled cyst in the  right middle lobe. No pleural fluid or consolidation. Hepatobiliary: Decreased hepatic density consistent with steatosis. No focal lesion. Gallbladder is decompressed, no calcified gallstone. No biliary dilatation. Pancreas: No ductal dilatation or inflammation. Spleen: Normal in size without focal abnormality. Splenule at the hilum. Adrenals/Urinary Tract: No adrenal nodule. No hydronephrosis or perinephric edema. Symmetric renal enhancement and excretion on delayed phase imaging. No focal renal lesion. Urinary bladder is distended without wall thickening. Stomach/Bowel: Asymmetric skin thickening and soft tissue induration about the right box in the gluteal cleft extends to the anorectal junction. No discrete fluid collection/abscess. Inflammation tracks distally along the gluteal crease and right perineal and buttock fat. No soft tissue air. No definite associated rectal inflammation. No bowel wall thickening, inflammation or abnormal distention. The appendix is normal. Stomach physiologically distended. Vascular/Lymphatic: No  significant vascular findings are present. No enlarged abdominal or pelvic lymph nodes. Small bilateral pelvic nodes are not enlarged. Reproductive: Uterus and bilateral adnexa are unremarkable. Prominent ovarian veins but no increased adnexal or periuterine vascularity. Other: No free air, free fluid, or intra-abdominal fluid collection. Musculoskeletal: There are no acute or suspicious osseous abnormalities. IMPRESSION: Right perirectal soft tissue induration/inflammation tracking about the gluteal crease and inferiorly but the subcutaneous tissues of the right buttock. No discrete fluid collection/abscess or soft tissue air. Electronically Signed   By: Rubye Oaks M.D.   On: 10/15/2016 23:58    Procedures Procedures (including critical care time)  Medications Ordered in ED Medications  fentaNYL (SUBLIMAZE) injection 100 mcg (100 mcg Intravenous Given 10/15/16 2240)    iopamidol (ISOVUE-300) 61 % injection (30 mLs  Contrast Given 10/15/16 2329)  iopamidol (ISOVUE-300) 61 % injection 100 mL (100 mLs Intravenous Contrast Given 10/15/16 2329)  sulfamethoxazole-trimethoprim (BACTRIM DS,SEPTRA DS) 800-160 MG per tablet 1 tablet (1 tablet Oral Given 10/16/16 0056)     Initial Impression / Assessment and Plan / ED Course  I have reviewed the triage vital signs and the nursing notes.  Pertinent labs & imaging results that were available during my care of the patient were reviewed by me and considered in my medical decision making (see chart for details).     41 y.o. F with PMH/o HTN, poorly controlled DM who presents with 1 week of progressively worsening redness and swelling to the right buttocks. Consider cellulitis vs abscess, though low suspicion given lack of fluctuance and abscess felt on rectal exam. Given large extent of the area of erythema and induration, will obtain CT abd/pelvis to eval for abscess. Will also check BMP and CBC. Analgesics given for pain relief.   Labs and imaging reviewed. CBC with slight leukocytosis. BMP with hyperglycemia. CT abd/pelvis shows no discrete fluid collection or evidence of abscess. Likely cellulitis. Given extent of cellulitis and poorly controlled DM, patient would likely benefit from admission to the hospital for IV antibiotics.   Discussed labs and imaging with patient. Explained that while there is no abscess seen on CT, given the extent of the cellulitis, being admitted for a short course of IV antibiotics would be beneficial. We discussed the option of being admitted at length, but patient does not want to be admitted due to the fact that her daughter is with her and has no way of getting home and other family concerns. We discussed the alternatives of receiving an IV dose of antibiotics while in the department, followed by PO antibiotics vs discharging her home with PO antibiotics. Explained that regardless of option, I  want her to follow-up in the ED in 2 days for wound recheck. Strongly encouraged receiving a dose of IV antibiotics while here to help combat any potential complications. Patient agrees to receive IV antibiotics here. IV Vancomycin ordered.   Informed by tech that patient has changed her mind regarding receiving IV antibiotics and would like to be discharged at this time with PO antibiotics. Discussed with patient at length. Given her family concerns and her daughter who is with her, she does not want to stay and receive IV antibiotics. Patient is afebrile, non-toxic appearing, sitting comfortably on examination table and is stable for discharge at this time. Will plan to send her home on a course of Bactrim DS. Instructed her to return to the ED in 2 days for re-evaluation or sooner if any complications. Return precautions discussed. Patient expresses understanding and  agreement to plan.     Final Clinical Impressions(s) / ED Diagnoses   Final diagnoses:  Cellulitis, unspecified cellulitis site    New Prescriptions Discharge Medication List as of 10/16/2016 12:46 AM    START taking these medications   Details  sulfamethoxazole-trimethoprim (BACTRIM DS,SEPTRA DS) 800-160 MG tablet Take 1 tablet by mouth 2 (two) times daily., Starting Thu 10/16/2016, Until Sun 10/26/2016, Print         Maxwell Caul, PA-C 10/16/16 1333    Bethann Berkshire, MD 10/21/16 2122

## 2016-10-16 MED ORDER — VANCOMYCIN HCL IN DEXTROSE 1-5 GM/200ML-% IV SOLN: 1000.0000 mg | Freq: Once | INTRAVENOUS | Status: DC

## 2016-10-16 MED ORDER — HYDROCODONE-ACETAMINOPHEN 5-325 MG PO TABS: 1.0000 | ORAL_TABLET | ORAL | 0 refills | Status: DC | PRN

## 2016-10-16 MED ORDER — SULFAMETHOXAZOLE-TRIMETHOPRIM 800-160 MG PO TABS: 1.0000 | ORAL_TABLET | Freq: Two times a day (BID) | ORAL | 0 refills | Status: AC

## 2016-10-16 MED ORDER — SULFAMETHOXAZOLE-TRIMETHOPRIM 800-160 MG PO TABS
1.0000 | ORAL_TABLET | Freq: Once | ORAL | Status: AC
  Administered 2016-10-16: 1 via ORAL
  Filled 2016-10-16: qty 1

## 2016-10-16 NOTE — Discharge Instructions (Signed)
Take the Bactrim as directed.  Return to the Emergency Department in 2 days for re-evaluation of the wound.   Return sooner for any high fever, worsening pain, nausea/vomiting or any other concerns.   Follow-up with your primary care in 1 week to get back on your metformin.   If you do not have a primary care doctor you see regularly, please you the list below. Please call them to arrange for follow-up.    No Primary Care Doctor Call [facility]  [phone] Other agencies that provide inexpensive medical care    [facility]  [phone]    [facility]  [phone]    [facility]  [phone]    [facility]  [phone]    [facility]  [phone]    [facility]  [phone]

## 2016-10-18 ENCOUNTER — Emergency Department (HOSPITAL_COMMUNITY)
Admission: EM | Admit: 2016-10-18 | Discharge: 2016-10-18 | Disposition: A | Discharge status: Home / Self Care | Payer: Medicaid - Out of State | Attending: Emergency Medicine | Admitting: Emergency Medicine

## 2016-10-18 ENCOUNTER — Encounter (HOSPITAL_COMMUNITY): Payer: Self-pay | Admitting: Emergency Medicine

## 2016-10-18 DIAGNOSIS — F17210 Nicotine dependence, cigarettes, uncomplicated: Secondary | ICD-10-CM | POA: Insufficient documentation

## 2016-10-18 DIAGNOSIS — I10 Essential (primary) hypertension: Secondary | ICD-10-CM | POA: Insufficient documentation

## 2016-10-18 DIAGNOSIS — Z79899 Other long term (current) drug therapy: Secondary | ICD-10-CM | POA: Insufficient documentation

## 2016-10-18 DIAGNOSIS — L0231 Cutaneous abscess of buttock: Secondary | ICD-10-CM

## 2016-10-18 DIAGNOSIS — Z7984 Long term (current) use of oral hypoglycemic drugs: Secondary | ICD-10-CM | POA: Insufficient documentation

## 2016-10-18 LAB — CBC WITH DIFFERENTIAL/PLATELET
BASOS ABS: 0 10*3/uL (ref 0.0–0.1)
BASOS PCT: 0 %
EOS ABS: 0.1 10*3/uL (ref 0.0–0.7)
EOS PCT: 1 %
HCT: 44 % (ref 36.0–46.0)
Hemoglobin: 15.3 g/dL — ABNORMAL HIGH (ref 12.0–15.0)
Lymphocytes Relative: 15 %
Lymphs Abs: 1.5 10*3/uL (ref 0.7–4.0)
MCH: 31 pg (ref 26.0–34.0)
MCHC: 34.8 g/dL (ref 30.0–36.0)
MCV: 89.1 fL (ref 78.0–100.0)
Monocytes Absolute: 0.5 10*3/uL (ref 0.1–1.0)
Monocytes Relative: 5 %
Neutro Abs: 7.9 10*3/uL — ABNORMAL HIGH (ref 1.7–7.7)
Neutrophils Relative %: 79 %
PLATELETS: 176 10*3/uL (ref 150–400)
RBC: 4.94 MIL/uL (ref 3.87–5.11)
RDW: 13.1 % (ref 11.5–15.5)
WBC: 10.1 10*3/uL (ref 4.0–10.5)

## 2016-10-18 LAB — BASIC METABOLIC PANEL
Anion gap: 9 (ref 5–15)
BUN: 7 mg/dL (ref 6–20)
CALCIUM: 8.6 mg/dL — AB (ref 8.9–10.3)
CO2: 25 mmol/L (ref 22–32)
CREATININE: 0.66 mg/dL (ref 0.44–1.00)
Chloride: 97 mmol/L — ABNORMAL LOW (ref 101–111)
Glucose, Bld: 413 mg/dL — ABNORMAL HIGH (ref 65–99)
Potassium: 3.8 mmol/L (ref 3.5–5.1)
SODIUM: 131 mmol/L — AB (ref 135–145)

## 2016-10-18 LAB — CBG MONITORING, ED: GLUCOSE-CAPILLARY: 305 mg/dL — AB (ref 65–99)

## 2016-10-18 MED ORDER — CEPHALEXIN 500 MG PO CAPS: 500.0000 mg | ORAL_CAPSULE | Freq: Four times a day (QID) | ORAL | 0 refills | Status: DC

## 2016-10-18 MED ORDER — LIDOCAINE-EPINEPHRINE (PF) 2 %-1:200000 IJ SOLN
10.0000 mL | Freq: Once | INTRAMUSCULAR | Status: AC
  Administered 2016-10-18: 10 mL
  Filled 2016-10-18: qty 20

## 2016-10-18 MED ORDER — VANCOMYCIN HCL IN DEXTROSE 1-5 GM/200ML-% IV SOLN
1000.0000 mg | Freq: Once | INTRAVENOUS | Status: AC
  Administered 2016-10-18: 1000 mg via INTRAVENOUS
  Filled 2016-10-18: qty 200

## 2016-10-18 MED ORDER — HYDROMORPHONE HCL 1 MG/ML IJ SOLN
0.5000 mg | Freq: Once | INTRAMUSCULAR | Status: AC
  Administered 2016-10-18: 0.5 mg via INTRAVENOUS
  Filled 2016-10-18: qty 1

## 2016-10-18 MED ORDER — SODIUM CHLORIDE 0.9 % IV BOLUS (SEPSIS)
1000.0000 mL | Freq: Once | INTRAVENOUS | Status: AC
  Administered 2016-10-18: 1000 mL via INTRAVENOUS

## 2016-10-18 MED ORDER — HYDROCODONE-ACETAMINOPHEN 5-325 MG PO TABS: 1.0000 | ORAL_TABLET | ORAL | 0 refills | Status: DC | PRN

## 2016-10-18 NOTE — ED Triage Notes (Signed)
Pt states abscess to right buttock is worse from a few days ago.  No I and D done.

## 2016-10-18 NOTE — Discharge Instructions (Signed)
Watch for worsening of symptoms. Follow-up in a couple days the primary care doctor as planned for a recheck.

## 2016-10-18 NOTE — ED Provider Notes (Signed)
[facility] Provider Note   CSN: [id number] Arrival date & time: 10/18/16  1312  By signing my name below, I, Bing Neighbors., attest that this documentation has been prepared under the direction and in the presence of Benjiman Core, MD. Electronically signed: Bing Neighbors., ED Scribe. 10/18/16. 1:31 PM.   History   Chief Complaint Chief Complaint  Patient presents with  . Wound Check    HPI Leslie Bates is a 41 y.o. female with hx of diabetes mellitus who presents to the Emergency Department complaining of abscess with onset x3 days. Pt states that she noticed a small abscess forming in her R medial buttock x10 days ago. She was seen at the ED x3 days ago and prescribed Bactrim. She states that since the ED visit, the abscess has gotten larger and has been draining. Pt denies fever. She denies hx of abscess. Of note, pt states that she has been compliant with her diabetes medication and began Bactrim x2 days ago.   The history is provided by the patient. No language interpreter was used.    Past Medical History:  Diagnosis Date  . Gestational diabetes   . Hypertension     There are no active problems to display for this patient.   Past Surgical History:  Procedure Laterality Date  . CESAREAN SECTION      OB History    No data available       Home Medications    Prior to Admission medications   Medication Sig Start Date End Date Taking? Authorizing Provider  doxycycline (VIBRAMYCIN) 100 MG capsule Take 1 capsule (100 mg total) by mouth 2 (two) times daily. 07/16/15   Ivery Quale, PA-C  HYDROcodone-acetaminophen (NORCO/VICODIN) 5-325 MG tablet Take 1-2 tablets by mouth every 4 (four) hours as needed. 10/16/16   Maxwell Caul, PA-C  HYDROcodone-homatropine (HYCODAN) 5-1.5 MG/5ML syrup Take 5 mLs by mouth every 6 (six) hours as needed. 07/16/15   Ivery Quale, PA-C  ibuprofen (ADVIL,MOTRIN) 200 MG tablet Take 800 mg by mouth every  6 (six) hours as needed.    Historical Provider, MD  lisinopril-hydrochlorothiazide (PRINZIDE,ZESTORETIC) 20-12.5 MG per tablet Take 1 tablet by mouth daily.    Historical Provider, MD  loratadine-pseudoephedrine (CLARITIN-D 12 HOUR) 5-120 MG tablet Take 1 tablet by mouth 2 (two) times daily. 07/16/15   Ivery Quale, PA-C  metFORMIN (GLUCOPHAGE) 500 MG tablet Take 500 mg by mouth daily.     Historical Provider, MD  omeprazole (PRILOSEC) 20 MG capsule Take 1 capsule (20 mg total) by mouth daily. 06/16/13   Ivery Quale, PA-C  promethazine (PHENERGAN) 25 MG tablet Take 1 tablet (25 mg total) by mouth every 6 (six) hours as needed for nausea or vomiting. 06/16/13   Ivery Quale, PA-C  ranitidine (ZANTAC) 150 MG tablet Take 1 tablet (150 mg total) by mouth 2 (two) times daily. 06/16/13   Ivery Quale, PA-C  sulfamethoxazole-trimethoprim (BACTRIM DS,SEPTRA DS) 800-160 MG tablet Take 1 tablet by mouth 2 (two) times daily. 10/16/16 10/26/16  Maxwell Caul, PA-C    Family History History reviewed. No pertinent family history.  Social History Social History  Substance Use Topics  . Smoking status: Current Every Day Smoker    Packs/day: 1.00    Types: Cigarettes  . Smokeless tobacco: Never Used  . Alcohol use No     Allergies   Erythromycin; Morphine and related; and Fentanyl   Review of Systems Review of Systems  Constitutional: Negative for  chills and fever.  Gastrointestinal: Negative for vomiting.  Skin:       Abscess to R lower medial buttock     Physical Exam Updated Vital Signs BP (!) 152/91 (BP Location: Left Arm)   Pulse 100   Temp 97.6 F (36.4 C) (Oral)   Resp 18   Ht  (1.6 m)   Wt 185 lb (83.9 kg)   LMP 09/30/2016   SpO2 95%   BMI 32.77 kg/m   Physical Exam  Constitutional: She appears well-developed and well-nourished. No distress.  Obese  HENT:  Head: Normocephalic and atraumatic.  Eyes: Conjunctivae are normal.  Neck: Neck supple.    Cardiovascular: Normal rate, regular rhythm and normal heart sounds.   No murmur heard. Pulmonary/Chest: Effort normal and breath sounds normal. No respiratory distress.  Abdominal: Soft. There is no tenderness.  Musculoskeletal: She exhibits no edema.  Neurological: She is alert.  Skin: Skin is warm and dry. There is erythema.  R lower medial buttock with 4.5 cm tender, indurated area with surrounding erythema,a and an area of purulent, bloody drainage.   Psychiatric: She has a normal mood and affect.  Nursing note and vitals reviewed.    ED Treatments / Results   DIAGNOSTIC STUDIES: Oxygen Saturation is 95% on RA, inadequate by my interpretation.   COORDINATION OF CARE: 1:39 PM-Discussed next steps with pt. Pt verbalized understanding and is agreeable with the plan.    Labs (all labs ordered are listed, but only abnormal results are displayed) Labs Reviewed - No data to display  EKG  EKG Interpretation None       Radiology No results found.  Procedures Procedures (including critical care time)  Medications Ordered in ED Medications - No data to display   Initial Impression / Assessment and Plan / ED Course  I have reviewed the triage vital signs and the nursing notes.  Pertinent labs & imaging results that were available during my care of the patient were reviewed by me and considered in my medical decision making (see chart for details).    INCISION AND DRAINAGE Performed by: Billee Cashing. Consent: Verbal consent obtained. Risks and benefits: risks, benefits and alternatives were discussed Type: abscess  Body area: right buttock  Anesthesia: local infiltration  Incision was made with a scalpel.  Local anesthetic: lidocaine 1% with epinephrine  Anesthetic total: 5 ml  Complexity: complex Blunt dissection to break up loculations  Drainage: purulent  Drainage amount: moderate  Packing material: 1/2 in iodoform gauze  Patient  tolerance: Patient tolerated the procedure well with no immediate complications.   Patient with buttock cellulitis that has now turned into an abscess. Drain in the ER. Culture sent. White count has improved however sugar is high. Recommended admission to the patient but she once again would not stay. Given a dose of IV vancomycin. Also started on Keflex for broader coverage over the Bactrim she has artery on. States she can follow-up with her primary care doctor 2 days. Will discharge home since patient refused admission.  Final Clinical Impressions(s) / ED Diagnoses   Final diagnoses:  None    New Prescriptions New Prescriptions   No medications on file   I personally performed the services described in this documentation, which was scribed in my presence. The recorded information has been reviewed and is accurate.       Benjiman Core, MD 10/18/16 [phone]

## 2016-10-18 NOTE — ED Notes (Signed)
Pt aware of cbg result. Pt reported," I am not waiting for that IV bag to infuse, ill leave before then." EDP aware of cbg result and reports would be in to talk with patient.

## 2016-10-21 LAB — AEROBIC CULTURE W GRAM STAIN (SUPERFICIAL SPECIMEN)

## 2016-10-22 ENCOUNTER — Telehealth: Payer: Self-pay | Admitting: Emergency Medicine

## 2016-10-22 NOTE — Telephone Encounter (Signed)
Post ED Visit - Positive Culture Follow-up  Culture report reviewed by antimicrobial stewardship pharmacist:   Enzo Bi, Pharm.D.  Celedonio Miyamoto, Pharm.D., BCPS AQ-ID  Garvin Fila, Pharm.D., BCPS  Georgina Pillion, Pharm.D., BCPS  [place], [address].D., BCPS, AAHIVP  Estella Husk, Pharm.D., BCPS, AAHIVP  Lysle Pearl, PharmD, BCPS  Casilda Carls, PharmD, BCPS  Pollyann Samples, PharmD, BCPS  Positive wound culture Treated with cephalexin, vancomycin, organism sensitive to the same and no further patient follow-up is required at this time.  Berle Mull 10/22/2016, 1:55 PM

## 2016-11-06 ENCOUNTER — Encounter (HOSPITAL_COMMUNITY): Payer: Self-pay | Admitting: Emergency Medicine

## 2016-11-06 ENCOUNTER — Emergency Department (HOSPITAL_COMMUNITY)
Admission: EM | Admit: 2016-11-06 | Discharge: 2016-11-06 | Disposition: A | Discharge status: Home / Self Care | Payer: Medicaid - Out of State | Attending: Emergency Medicine | Admitting: Emergency Medicine

## 2016-11-06 DIAGNOSIS — L0231 Cutaneous abscess of buttock: Secondary | ICD-10-CM | POA: Insufficient documentation

## 2016-11-06 DIAGNOSIS — F17210 Nicotine dependence, cigarettes, uncomplicated: Secondary | ICD-10-CM | POA: Insufficient documentation

## 2016-11-06 DIAGNOSIS — E1165 Type 2 diabetes mellitus with hyperglycemia: Secondary | ICD-10-CM

## 2016-11-06 DIAGNOSIS — Z7984 Long term (current) use of oral hypoglycemic drugs: Secondary | ICD-10-CM | POA: Insufficient documentation

## 2016-11-06 DIAGNOSIS — L0291 Cutaneous abscess, unspecified: Secondary | ICD-10-CM

## 2016-11-06 DIAGNOSIS — Z79899 Other long term (current) drug therapy: Secondary | ICD-10-CM | POA: Insufficient documentation

## 2016-11-06 DIAGNOSIS — I10 Essential (primary) hypertension: Secondary | ICD-10-CM | POA: Insufficient documentation

## 2016-11-06 DIAGNOSIS — L03317 Cellulitis of buttock: Secondary | ICD-10-CM | POA: Insufficient documentation

## 2016-11-06 DIAGNOSIS — E119 Type 2 diabetes mellitus without complications: Secondary | ICD-10-CM | POA: Insufficient documentation

## 2016-11-06 LAB — COMPREHENSIVE METABOLIC PANEL
ALBUMIN: 3.2 g/dL — AB (ref 3.5–5.0)
ALT: 52 U/L (ref 14–54)
AST: 30 U/L (ref 15–41)
Alkaline Phosphatase: 95 U/L (ref 38–126)
Anion gap: 9 (ref 5–15)
BILIRUBIN TOTAL: 0.5 mg/dL (ref 0.3–1.2)
BUN: 11 mg/dL (ref 6–20)
CALCIUM: 8.4 mg/dL — AB (ref 8.9–10.3)
CO2: 25 mmol/L (ref 22–32)
Chloride: 97 mmol/L — ABNORMAL LOW (ref 101–111)
Creatinine, Ser: 0.52 mg/dL (ref 0.44–1.00)
GFR calc Af Amer: >60 mL/min (ref >60)
GFR calc non Af Amer: >60 mL/min (ref >60)
GLUCOSE: 453 mg/dL — AB (ref 65–99)
Potassium: 3.8 mmol/L (ref 3.5–5.1)
Sodium: 131 mmol/L — ABNORMAL LOW (ref 135–145)
TOTAL PROTEIN: 6.6 g/dL (ref 6.5–8.1)

## 2016-11-06 LAB — CBG MONITORING, ED: GLUCOSE-CAPILLARY: 432 mg/dL — AB (ref 65–99)

## 2016-11-06 LAB — CBC WITH DIFFERENTIAL/PLATELET
BASOS ABS: 0 10*3/uL (ref 0.0–0.1)
BASOS PCT: 0 %
EOS ABS: 0.1 10*3/uL (ref 0.0–0.7)
EOS PCT: 1 %
HCT: 40.6 % (ref 36.0–46.0)
Hemoglobin: 14 g/dL (ref 12.0–15.0)
Lymphocytes Relative: 12 %
Lymphs Abs: 1.4 10*3/uL (ref 0.7–4.0)
MCH: 30.4 pg (ref 26.0–34.0)
MCHC: 34.5 g/dL (ref 30.0–36.0)
MCV: 88.1 fL (ref 78.0–100.0)
MONO ABS: 0.7 10*3/uL (ref 0.1–1.0)
Monocytes Relative: 6 %
Neutro Abs: 9 10*3/uL — ABNORMAL HIGH (ref 1.7–7.7)
Neutrophils Relative %: 81 %
Platelets: 182 10*3/uL (ref 150–400)
RBC: 4.61 MIL/uL (ref 3.87–5.11)
RDW: 12.9 % (ref 11.5–15.5)
WBC: 11.2 10*3/uL — ABNORMAL HIGH (ref 4.0–10.5)

## 2016-11-06 LAB — URINALYSIS, ROUTINE W REFLEX MICROSCOPIC
BILIRUBIN URINE: NEGATIVE
Ketones, ur: 5 mg/dL — AB
NITRITE: NEGATIVE
PH: 5 (ref 5.0–8.0)
Protein, ur: NEGATIVE mg/dL
SPECIFIC GRAVITY, URINE: 1.04 — AB (ref 1.005–1.030)

## 2016-11-06 LAB — PREGNANCY, URINE: Preg Test, Ur: NEGATIVE

## 2016-11-06 MED ORDER — GLYBURIDE 5 MG PO TABS: 5.0000 mg | ORAL_TABLET | Freq: Two times a day (BID) | ORAL | 0 refills | Status: DC

## 2016-11-06 MED ORDER — HYDROCODONE-ACETAMINOPHEN 5-325 MG PO TABS: 1.0000 | ORAL_TABLET | ORAL | 0 refills | Status: DC | PRN

## 2016-11-06 MED ORDER — FENTANYL CITRATE (PF) 100 MCG/2ML IJ SOLN
50.0000 ug | Freq: Once | INTRAMUSCULAR | Status: AC
  Administered 2016-11-06: 50 ug via INTRAVENOUS
  Filled 2016-11-06: qty 2

## 2016-11-06 MED ORDER — VANCOMYCIN HCL IN DEXTROSE 1-5 GM/200ML-% IV SOLN
1000.0000 mg | Freq: Once | INTRAVENOUS | Status: AC
  Administered 2016-11-06: 1000 mg via INTRAVENOUS
  Filled 2016-11-06: qty 200

## 2016-11-06 MED ORDER — SODIUM CHLORIDE 0.9 % IV BOLUS (SEPSIS)
1000.0000 mL | Freq: Once | INTRAVENOUS | Status: AC
  Administered 2016-11-06: 1000 mL via INTRAVENOUS

## 2016-11-06 MED ORDER — SULFAMETHOXAZOLE-TRIMETHOPRIM 800-160 MG PO TABS: 1.0000 | ORAL_TABLET | Freq: Two times a day (BID) | ORAL | 0 refills | Status: AC

## 2016-11-06 MED ORDER — INSULIN ASPART 100 UNIT/ML ~~LOC~~ SOLN
10.0000 [IU] | Freq: Once | SUBCUTANEOUS | Status: AC
  Administered 2016-11-06: 10 [IU] via INTRAVENOUS
  Filled 2016-11-06: qty 1

## 2016-11-06 MED ORDER — ONDANSETRON HCL 4 MG/2ML IJ SOLN
4.0000 mg | Freq: Once | INTRAMUSCULAR | Status: AC
  Administered 2016-11-06: 4 mg via INTRAVENOUS
  Filled 2016-11-06: qty 2

## 2016-11-06 MED ORDER — LIDOCAINE-EPINEPHRINE (PF) 2 %-1:200000 IJ SOLN
10.0000 mL | Freq: Once | INTRAMUSCULAR | Status: AC
  Administered 2016-11-06: 2 mL
  Filled 2016-11-06: qty 20

## 2016-11-06 NOTE — ED Provider Notes (Signed)
[facility]Y DEPT Provider Note   CSN: [id number] Arrival date & time: 11/06/16  1130  By signing my name below, I, Sonum Patel, attest that this documentation has been prepared under the direction and in the presence of Jacalyn Lefevre, MD. Electronically Signed: Leone Payor, Scribe. 11/06/16. 12:19 PM.  History   Chief Complaint Chief Complaint  Patient presents with  . Cellulitis    The history is provided by the patient. No language interpreter was used.     HPI Comments: Leslie Bates is a 41 y.o. female who presents to the Emergency Department complaining of a recurrent area of pain and swelling to the right buttock that returned 2 days ago. She was seen on 10/15/16 for a small abscess and was discharged home with Bactrim DS. She returned to the ED on 10/18/16 as the abscess worsened. She had a CT and US of the abscess and it was I&D'd and packing was placed. She was started on Keflex and was discharged home. She states the area somewhat healed before the pain and swelling returned 2 days ago. She has a history of DM and stopped taking her Metformin for 1 week as it causes her to have diarrhea and that irritates the location of the abscess. She has not checked her blood sugars for that time period.    Past Medical History:  Diagnosis Date  . Gestational diabetes   . Hypertension     There are no active problems to display for this patient.   Past Surgical History:  Procedure Laterality Date  . CESAREAN SECTION      OB History    No data available       Home Medications    Prior to Admission medications   Medication Sig Start Date End Date Taking? Authorizing Provider  ibuprofen (ADVIL,MOTRIN) 200 MG tablet Take 800 mg by mouth every 6 (six) hours as needed.   Yes [provider]  ranitidine (ZANTAC) 150 MG tablet Take 1 tablet (150 mg total) by mouth 2 (two) times daily. 06/16/13  Yes Ivery Quale, PA-C  cephALEXin (KEFLEX) 500 MG capsule Take 1  capsule (500 mg total) by mouth 4 (four) times daily. Patient not taking: Reported on 11/06/2016 10/18/16   Benjiman Core, MD  glyBURIDE (DIABETA) 5 MG tablet Take 1 tablet (5 mg total) by mouth 2 (two) times daily with a meal. 11/06/16   Jacalyn Lefevre, MD  HYDROcodone-acetaminophen (NORCO/VICODIN) 5-325 MG tablet Take 1 tablet by mouth every 4 (four) hours as needed. 11/06/16   Jacalyn Lefevre, MD  HYDROcodone-homatropine [facility]) 5-1.5 MG/5ML syrup Take 5 mLs by mouth every 6 (six) hours as needed. Patient not taking: Reported on 10/18/2016 07/16/15   Ivery Quale, PA-C  lisinopril-hydrochlorothiazide (PRINZIDE,ZESTORETIC) 20-12.5 MG per tablet Take 1 tablet by mouth daily.    [provider]  loratadine-pseudoephedrine (CLARITIN-D 12 HOUR) 5-120 MG tablet Take 1 tablet by mouth 2 (two) times daily. Patient not taking: Reported on 10/18/2016 07/16/15   Ivery Quale, PA-C  metFORMIN (GLUCOPHAGE) 500 MG tablet Take 1,000 mg by mouth 2 (two) times daily with a meal.     [provider]  promethazine (PHENERGAN) 25 MG tablet Take 1 tablet (25 mg total) by mouth every 6 (six) hours as needed for nausea or vomiting. Patient not taking: Reported on 10/18/2016 06/16/13   Ivery Quale, PA-C  sulfamethoxazole-trimethoprim (BACTRIM DS,SEPTRA DS) 800-160 MG tablet Take 1 tablet by mouth 2 (two) times daily. 11/06/16 11/13/16  Jacalyn Lefevre, MD  Family History No family history on file.  Social History Social History  Substance Use Topics  . Smoking status: Current Every Day Smoker    Packs/day: 1.00    Types: Cigarettes  . Smokeless tobacco: Never Used  . Alcohol use No     Allergies   Erythromycin; Morphine and related; and Fentanyl   Review of Systems Review of Systems  Constitutional: Negative for fever.  Skin: Positive for wound.     Physical Exam Updated Vital Signs BP 131/66 (BP Location: Left Arm)   Pulse 72   Temp 97.8 F (36.6 C) (Oral)   Resp 15    Ht 5\' 3"  (1.6 m)   Wt 205 lb (93 kg)   LMP 10/31/2016   SpO2 94%   BMI 36.31 kg/m   Physical Exam  Constitutional: She is oriented to person, place, and time. She appears well-developed and well-nourished.  HENT:  Head: Normocephalic.  Eyes: EOM are normal.  Neck: Normal range of motion.  Pulmonary/Chest: Effort normal.  Abdominal: She exhibits no distension.  Musculoskeletal: Normal range of motion.  Neurological: She is alert and oriented to person, place, and time.  Skin:  10 x 10 cm abscess in crease of right buttock  Psychiatric: She has a normal mood and affect.  Nursing note and vitals reviewed.    ED Treatments / Results  DIAGNOSTIC STUDIES: Oxygen Saturation is 98% on RA, normal by my interpretation.    COORDINATION OF CARE: 11:45 AM Discussed treatment plan with pt at bedside and pt agreed to plan.   Labs (all labs ordered are listed, but only abnormal results are displayed) Labs Reviewed  COMPREHENSIVE METABOLIC PANEL - Abnormal; Notable for the following:       Result Value   Sodium 131 (*)    Chloride 97 (*)    Glucose, Bld 453 (*)    Calcium 8.4 (*)    Albumin 3.2 (*)    All other components within normal limits  CBC WITH DIFFERENTIAL/PLATELET - Abnormal; Notable for the following:    WBC 11.2 (*)    Neutro Abs 9.0 (*)    All other components within normal limits  CBG MONITORING, ED - Abnormal; Notable for the following:    Glucose-Capillary 432 (*)    All other components within normal limits  PREGNANCY, URINE  URINALYSIS, ROUTINE W REFLEX MICROSCOPIC    EKG  EKG Interpretation None       Radiology No results found.  Procedures .Marland KitchenIncision and Drainage Date/Time: 11/06/2016 2:28 PM Performed by: Jacalyn Lefevre Authorized by: Jacalyn Lefevre   Consent:    Consent obtained:  Verbal   Consent given by:  Patient   Risks discussed:  Bleeding and incomplete drainage   Alternatives discussed:  No treatment Location:    Type:   Abscess   Size:  10 by 10   Location:  Lower extremity   Lower extremity location:  Buttock   Buttock location:  R buttock Pre-procedure details:    Skin preparation:  Chloraprep Anesthesia (see MAR for exact dosages):    Anesthesia method:  Local infiltration   Local anesthetic:  Lidocaine 2% WITH epi Procedure type:    Complexity:  Simple Procedure details:    Incision types:  Single straight   Scalpel blade:  11   Drainage amount:  Copious   Wound treatment:  Wound left open   Packing materials:  None Post-procedure details:    Patient tolerance of procedure:  Tolerated well, no immediate complications   (  including critical care time)  Medications Ordered in ED Medications  sodium chloride 0.9 % bolus 1,000 mL (0 mLs Intravenous Stopped 11/06/16 1333)  vancomycin (VANCOCIN) IVPB 1000 mg/200 mL premix (0 mg Intravenous Stopped 11/06/16 1316)  ondansetron (ZOFRAN) injection 4 mg (4 mg Intravenous Given 11/06/16 1216)  fentaNYL (SUBLIMAZE) injection 50 mcg (50 mcg Intravenous Given 11/06/16 1215)  lidocaine-EPINEPHrine (XYLOCAINE W/EPI) 2 %-1:200000 (PF) injection 10 mL (2 mLs Infiltration Given by Other 11/06/16 1425)  insulin aspart (novoLOG) injection 10 Units (10 Units Intravenous Given 11/06/16 1333)     Initial Impression / Assessment and Plan / ED Course  I have reviewed the triage vital signs and the nursing notes.  Pertinent labs & imaging results that were available during my care of the patient were reviewed by me and considered in my medical decision making (see chart for details).   Pt's blood sugar is elevated due to noncompliance.  I gave her 10 units of IV insulin here.  Pt also will be started on glyburide to see if that helps better than metformin without the diarrhea.   Pt given 1 g vancomycin here and a rx for bactrim.  Pt offered admission, but wants to go home.  She is feeling much better.  Pt will come back tomorrow for a wound check.  She knows to come  earlier for any other sx.   Final Clinical Impressions(s) / ED Diagnoses   Final diagnoses:  Abscess  Cellulitis of buttock  Poorly controlled type 2 diabetes mellitus (HCC)    New Prescriptions New Prescriptions   GLYBURIDE (DIABETA) 5 MG TABLET    Take 1 tablet (5 mg total) by mouth 2 (two) times daily with a meal.   HYDROCODONE-ACETAMINOPHEN (NORCO/VICODIN) 5-325 MG TABLET    Take 1 tablet by mouth every 4 (four) hours as needed.   SULFAMETHOXAZOLE-TRIMETHOPRIM (BACTRIM DS,SEPTRA DS) 800-160 MG TABLET    Take 1 tablet by mouth 2 (two) times daily.  I personally performed the services described in this documentation, which was scribed in my presence. The recorded information has been reviewed and is accurate.    Jacalyn LefevreHaviland, Maudell Stanbrough, MD 11/06/16 1430

## 2016-11-06 NOTE — ED Triage Notes (Signed)
Patient states she finished antibiotic treatment for cellulitis of right buttocks. States pain and infection of come back worse than before in same location.

## 2016-11-07 ENCOUNTER — Emergency Department (HOSPITAL_COMMUNITY)
Admission: EM | Admit: 2016-11-07 | Discharge: 2016-11-07 | Disposition: A | Discharge status: Home / Self Care | Payer: Self-pay | Attending: Emergency Medicine | Admitting: Emergency Medicine

## 2016-11-07 ENCOUNTER — Encounter (HOSPITAL_COMMUNITY): Payer: Self-pay | Admitting: Emergency Medicine

## 2016-11-07 DIAGNOSIS — Z4800 Encounter for change or removal of nonsurgical wound dressing: Secondary | ICD-10-CM | POA: Insufficient documentation

## 2016-11-07 DIAGNOSIS — I10 Essential (primary) hypertension: Secondary | ICD-10-CM | POA: Insufficient documentation

## 2016-11-07 DIAGNOSIS — Z5189 Encounter for other specified aftercare: Secondary | ICD-10-CM

## 2016-11-07 DIAGNOSIS — Z79899 Other long term (current) drug therapy: Secondary | ICD-10-CM | POA: Insufficient documentation

## 2016-11-07 DIAGNOSIS — Z7984 Long term (current) use of oral hypoglycemic drugs: Secondary | ICD-10-CM | POA: Insufficient documentation

## 2016-11-07 DIAGNOSIS — F17210 Nicotine dependence, cigarettes, uncomplicated: Secondary | ICD-10-CM | POA: Insufficient documentation

## 2016-11-07 LAB — CBG MONITORING, ED: GLUCOSE-CAPILLARY: 325 mg/dL — AB (ref 65–99)

## 2016-11-07 NOTE — ED Triage Notes (Signed)
Here for wound check on r buttocks. Was here yesterday

## 2016-11-07 NOTE — ED Provider Notes (Signed)
[facility]-EM[facility] DEPT Provider Note   CSN: [id number] Arrival date & time: 11/07/16  1513     History   Chief Complaint Chief Complaint  Patient presents with  . Wound Check    HPI Leslie Bates is a 41 y.o. female who presents for a wound recheck. Patient was [facility] department yesterday for evaluation of abscess where she had an I&D done. Patient states that she was prescribed antibiotics and has been taking them appropriately. She states that pain has improved since procedure yesterday. She has been changing the dressing. She notes some drainage this morning. She denies any fever. She states she has also been taking the glyburide that she was prescribed yesterday.   The history is provided by the patient.    Past Medical History:  Diagnosis Date  . Gestational diabetes   . Hypertension     There are no active problems to display for this patient.   Past Surgical History:  Procedure Laterality Date  . CESAREAN SECTION      OB History    No data available       Home Medications    Prior to Admission medications   Medication Sig Start Date End Date Taking? Authorizing Provider  cephALEXin (KEFLEX) 500 MG capsule Take 1 capsule (500 mg total) by mouth 4 (four) times daily. Patient not taking: Reported on 11/06/2016 10/18/16   Benjiman CorePickering, Nathan, MD  glyBURIDE (DIABETA) 5 MG tablet Take 1 tablet (5 mg total) by mouth 2 (two) times daily with a meal. 11/06/16   Jacalyn LefevreHaviland, Julie, MD  HYDROcodone-acetaminophen (NORCO/VICODIN) 5-325 MG tablet Take 1 tablet by mouth every 4 (four) hours as needed. 11/06/16   Jacalyn LefevreHaviland, Julie, MD  HYDROcodone-homatropine [facility](HYCODAN) 5-1.5 MG/5ML syrup Take 5 mLs by mouth every 6 (six) hours as needed. Patient not taking: Reported on 10/18/2016 07/16/15   Ivery QualeBryant, Hobson, PA-C  ibuprofen (ADVIL,MOTRIN) 200 MG tablet Take 800 mg by mouth every 6 (six) hours as needed.    [provider]  lisinopril-hydrochlorothiazide  (PRINZIDE,ZESTORETIC) 20-12.5 MG per tablet Take 1 tablet by mouth daily.    [provider]  loratadine-pseudoephedrine (CLARITIN-D 12 HOUR) 5-120 MG tablet Take 1 tablet by mouth 2 (two) times daily. Patient not taking: Reported on 10/18/2016 07/16/15   Ivery QualeBryant, Hobson, PA-C  metFORMIN (GLUCOPHAGE) 500 MG tablet Take 1,000 mg by mouth 2 (two) times daily with a meal.     [provider]  promethazine (PHENERGAN) 25 MG tablet Take 1 tablet (25 mg total) by mouth every 6 (six) hours as needed for nausea or vomiting. Patient not taking: Reported on 10/18/2016 06/16/13   Ivery QualeBryant, Hobson, PA-C  ranitidine (ZANTAC) 150 MG tablet Take 1 tablet (150 mg total) by mouth 2 (two) times daily. 06/16/13   Ivery QualeBryant, Hobson, PA-C  sulfamethoxazole-trimethoprim (BACTRIM DS,SEPTRA DS) 800-160 MG tablet Take 1 tablet by mouth 2 (two) times daily. 11/06/16 11/13/16  Jacalyn LefevreHaviland, Julie, MD    Family History History reviewed. No pertinent family history.  Social History Social History  Substance Use Topics  . Smoking status: Current Every Day Smoker    Packs/day: 1.00    Types: Cigarettes  . Smokeless tobacco: Never Used  . Alcohol use No     Allergies   Erythromycin; Morphine and related; and Fentanyl   Review of Systems Review of Systems  Constitutional: Negative for fever.  Skin: Positive for wound.  All other systems reviewed and are negative.    Physical Exam Updated Vital Signs  BP 130/84 (BP Location: Right Arm)   Pulse 92   Temp 97.2 F (36.2 C) (Oral)   Resp 16   LMP 10/31/2016   SpO2 97%   Physical Exam  Constitutional: She appears well-developed and well-nourished.  HENT:  Head: Normocephalic and atraumatic.  Eyes: Conjunctivae and EOM are normal. Right eye exhibits no discharge. Left eye exhibits no discharge. No scleral icterus.  Pulmonary/Chest: Effort normal.  Musculoskeletal: She exhibits no deformity.  Neurological: She is alert.  Skin: Skin is warm and dry.  Capillary refill takes less than 2 seconds.  Moderately sized area of erythema and slight induration with open incision site to the right buttock. No active drainage. No warmth. Slight tenderness to palpation.   Psychiatric: She has a normal mood and affect. Her speech is normal and behavior is normal.     ED Treatments / Results  Labs (all labs ordered are listed, but only abnormal results are displayed) Labs Reviewed  CBG MONITORING, ED - Abnormal; Notable for the following:       Result Value   Glucose-Capillary 325 (*)    All other components within normal limits    EKG  EKG Interpretation None       Radiology No results found.  Procedures Procedures (including critical care time)  Medications Ordered in ED Medications - No data to display   Initial Impression / Assessment and Plan / ED Course  I have reviewed the triage vital signs and the nursing notes.  Pertinent labs & imaging results that were available during my care of the patient were reviewed by me and considered in my medical decision making (see chart for details).     76 female with uncontrolled diabetes presents for wound check after an I&D of an abscess to the right buttock yesterday. Patient has been compliant with glyburide and Bactrim. She reports improvement of pain since I&D procedure yesterday. She denies any fevers at home. She's been changing the dressing. Physical exam shows moderately sized area of erythema with slight induration to the right buttock. Incision site is open with no active drainage. No surrounding warmth to the site. Non indication for repeat I&D at this time. Will plan to continue patient on bactrim. Strict wound care instructions given. Given that patient has significant risk factors that threaten wound healing, will have her follow-up in the ED in 48 hours for recheck of wound to make sure it is adequately healing properly. Strict return precautions given. Patient expresses  understanding and agreement to plan.   Final Clinical Impressions(s) / ED Diagnoses   Final diagnoses:  Visit for wound check    New Prescriptions Discharge Medication List as of 11/07/2016  4:26 PM       Maxwell Caul, PA-C 11/07/16 2206    Bethann Berkshire, MD 11/07/16 2301

## 2016-11-07 NOTE — Discharge Instructions (Signed)
Continue taking antibiotics as prescribed.  Make sure you're keeping the wound clean and dry. Change the dressing at least once a day. Make sure the area is clean and dry.   Return the emergency department 2 days for reevaluation of the wound.  Return to the emergency department sooner if he started experiencing fever, worsening redness or swelling, worsening pain or any other worsening or concerning symptoms.   RESOURCE GUIDE  [facility]  [facility]     [employer]                          [facility] [address] [place]                       [address]      [address]  [place]                                                [place]                            [place] Phone:  [phone]                                   Phone:  [phone]                 Phone:  [phone]  [facility] Phone:  [phone]  [facility] Child Abuse Hotline [phone] (After Hours)

## 2017-05-29 ENCOUNTER — Emergency Department (HOSPITAL_COMMUNITY): Payer: Self-pay

## 2017-05-29 ENCOUNTER — Emergency Department (HOSPITAL_COMMUNITY)
Admission: EM | Admit: 2017-05-29 | Discharge: 2017-05-29 | Disposition: A | Discharge status: Home / Self Care | Payer: Self-pay | Attending: Emergency Medicine | Admitting: Emergency Medicine

## 2017-05-29 ENCOUNTER — Other Ambulatory Visit: Payer: Self-pay

## 2017-05-29 ENCOUNTER — Encounter (HOSPITAL_COMMUNITY): Payer: Self-pay | Admitting: Emergency Medicine

## 2017-05-29 DIAGNOSIS — R319 Hematuria, unspecified: Secondary | ICD-10-CM | POA: Insufficient documentation

## 2017-05-29 DIAGNOSIS — I10 Essential (primary) hypertension: Secondary | ICD-10-CM | POA: Insufficient documentation

## 2017-05-29 DIAGNOSIS — R109 Unspecified abdominal pain: Secondary | ICD-10-CM | POA: Insufficient documentation

## 2017-05-29 DIAGNOSIS — F17210 Nicotine dependence, cigarettes, uncomplicated: Secondary | ICD-10-CM | POA: Insufficient documentation

## 2017-05-29 LAB — BASIC METABOLIC PANEL
ANION GAP: 9 (ref 5–15)
BUN: 7 mg/dL (ref 6–20)
CALCIUM: 8.4 mg/dL — AB (ref 8.9–10.3)
CO2: 24 mmol/L (ref 22–32)
CREATININE: 0.43 mg/dL — AB (ref 0.44–1.00)
Chloride: 100 mmol/L — ABNORMAL LOW (ref 101–111)
GFR calc Af Amer: >60 mL/min (ref >60)
GLUCOSE: 287 mg/dL — AB (ref 65–99)
Potassium: 3.6 mmol/L (ref 3.5–5.1)
Sodium: 133 mmol/L — ABNORMAL LOW (ref 135–145)

## 2017-05-29 LAB — URINALYSIS, ROUTINE W REFLEX MICROSCOPIC
BILIRUBIN URINE: NEGATIVE
KETONES UR: NEGATIVE mg/dL
Nitrite: NEGATIVE
PROTEIN: NEGATIVE mg/dL
Specific Gravity, Urine: 1.013 (ref 1.005–1.030)
pH: 5 (ref 5.0–8.0)

## 2017-05-29 LAB — CBC
HEMATOCRIT: 45.9 % (ref 36.0–46.0)
HEMOGLOBIN: 15.4 g/dL — AB (ref 12.0–15.0)
MCH: 29.7 pg (ref 26.0–34.0)
MCHC: 33.6 g/dL (ref 30.0–36.0)
MCV: 88.6 fL (ref 78.0–100.0)
Platelets: 178 10*3/uL (ref 150–400)
RBC: 5.18 MIL/uL — AB (ref 3.87–5.11)
RDW: 13.1 % (ref 11.5–15.5)
WBC: 6.8 10*3/uL (ref 4.0–10.5)

## 2017-05-29 LAB — CBG MONITORING, ED: Glucose-Capillary: 272 mg/dL — ABNORMAL HIGH (ref 65–99)

## 2017-05-29 LAB — PREGNANCY, URINE: PREG TEST UR: NEGATIVE

## 2017-05-29 MED ORDER — CEPHALEXIN 500 MG PO CAPS: 500.0000 mg | ORAL_CAPSULE | Freq: Two times a day (BID) | ORAL | 0 refills | Status: DC

## 2017-05-29 MED ORDER — ACETAMINOPHEN 500 MG PO TABS: 1000.0000 mg | ORAL_TABLET | Freq: Once | ORAL | Status: DC

## 2017-05-29 MED ORDER — METFORMIN HCL 500 MG PO TABS: 500.0000 mg | ORAL_TABLET | Freq: Two times a day (BID) | ORAL | 0 refills | Status: DC

## 2017-05-29 NOTE — ED Provider Notes (Signed)
[facility] EMERGENCY DEPARTMENT Provider Note   CSN: [id number] Arrival date & time: 05/29/17  1239     History   Chief Complaint Chief Complaint  Patient presents with  . Flank Pain    HPI Leslie Bates is a 41 y.o. female.  Patient with history of high blood pressure and diabetes not currently on medications and not controlled presents with right flank pain and urinary changes for the past week. No history of significant urine infection, no history of kidney stone. Patient had episode of fever chills, diarrhea and vomiting that is improved. Her right flank pain is fairly constant deep ache.      Past Medical History:  Diagnosis Date  . Gestational diabetes   . Hypertension     There are no active problems to display for this patient.   Past Surgical History:  Procedure Laterality Date  . CESAREAN SECTION      OB History    Gravida Para Term Preterm AB Living   5 1 1   4 1    SAB TAB Ectopic Multiple Live Births   4               Home Medications    Prior to Admission medications   Medication Sig Start Date End Date Taking? Authorizing Provider  ranitidine (ZANTAC) 150 MG tablet Take 1 tablet (150 mg total) by mouth 2 (two) times daily. 06/16/13  Yes Ivery QualeBryant, Hobson, PA-C  cephALEXin (KEFLEX) 500 MG capsule Take 1 capsule (500 mg total) by mouth 2 (two) times daily. 05/29/17   Blane OharaZavitz, Mc Hollen, MD  metFORMIN (GLUCOPHAGE) 500 MG tablet Take 1 tablet (500 mg total) by mouth 2 (two) times daily with a meal. 05/29/17   Blane OharaZavitz, Lantz Hermann, MD    Family History No family history on file.  Social History Social History   Tobacco Use  . Smoking status: Current Every Day Smoker    Packs/day: 1.00    Types: Cigarettes  . Smokeless tobacco: Never Used  Substance Use Topics  . Alcohol use: No  . Drug use: No     Allergies   Erythromycin; Morphine and related; and Fentanyl   Review of Systems Review of Systems  Constitutional: Negative for chills and  fever.  HENT: Negative for congestion.   Eyes: Negative for visual disturbance.  Respiratory: Negative for shortness of breath.   Cardiovascular: Negative for chest pain.  Gastrointestinal: Negative for abdominal pain and vomiting.  Genitourinary: Positive for flank pain and hematuria. Negative for dysuria.  Musculoskeletal: Negative for back pain, neck pain and neck stiffness.  Skin: Negative for rash.  Neurological: Negative for light-headedness and headaches.     Physical Exam Updated Vital Signs BP (!) 141/81 (BP Location: Right Wrist)   Pulse (!) 101   Temp 98.2 F (36.8 C) (Oral)   Resp 18   Ht 5\' 3"  (1.6 m)   Wt 95.3 kg (210 lb)   LMP 05/29/2017   SpO2 97%   BMI 37.20 kg/m   Physical Exam  Constitutional: She is oriented to person, place, and time. She appears well-developed and well-nourished.  HENT:  Head: Normocephalic and atraumatic.  Eyes: Conjunctivae are normal. Right eye exhibits no discharge. Left eye exhibits no discharge.  Neck: Normal range of motion. Neck supple. No tracheal deviation present.  Cardiovascular: Normal rate and regular rhythm.  Pulmonary/Chest: Effort normal and breath sounds normal.  Abdominal: Soft. She exhibits no distension. There is no tenderness. There is no guarding.  Musculoskeletal: She  exhibits no edema.  Neurological: She is alert and oriented to person, place, and time.  Skin: Skin is warm. No rash noted.  Psychiatric: She has a normal mood and affect.  Nursing note and vitals reviewed.    ED Treatments / Results  Labs (all labs ordered are listed, but only abnormal results are displayed) Labs Reviewed  URINALYSIS, ROUTINE W REFLEX MICROSCOPIC - Abnormal; Notable for the following components:      Result Value   APPearance CLOUDY (*)    Glucose, UA >=500 (*)    Hgb urine dipstick LARGE (*)    Leukocytes, UA SMALL (*)    Bacteria, UA RARE (*)    Squamous Epithelial / LPF 6-30 (*)    All other components within  normal limits  BASIC METABOLIC PANEL - Abnormal; Notable for the following components:   Sodium 133 (*)    Chloride 100 (*)    Glucose, Bld 287 (*)    Creatinine, Ser 0.43 (*)    Calcium 8.4 (*)    All other components within normal limits  CBC - Abnormal; Notable for the following components:   RBC 5.18 (*)    Hemoglobin 15.4 (*)    All other components within normal limits  CBG MONITORING, ED - Abnormal; Notable for the following components:   Glucose-Capillary 272 (*)    All other components within normal limits  URINE CULTURE  PREGNANCY, URINE    EKG  EKG Interpretation None       Radiology No results found.  Procedures Procedures (including critical care time)  Medications Ordered in ED Medications  acetaminophen (TYLENOL) tablet 1,000 mg (not administered)     Initial Impression / Assessment and Plan / ED Course  I have reviewed the triage vital signs and the nursing notes.  Pertinent labs & imaging results that were available during my care of the patient were reviewed by me and considered in my medical decision making (see chart for details).     Patient presents with clinical concern for kidney infection with flank pain and change in urine consistency. Long discussion regarding the risks of uncontrolled diabetic and patient understands and will try to improve her lifestyle and start taking her medicines again. Urinalysis concern for possible early infection versus kidney stone. CT scan ordered for further delineation of patient's signed up to follow up CT and sent home with antibiotics with close outpatient follow-up.  Final Clinical Impressions(s) / ED Diagnoses   Final diagnoses:  Hematuria, unspecified type  Acute right flank pain    ED Discharge Orders        Ordered    cephALEXin (KEFLEX) 500 MG capsule  2 times daily     05/29/17 1518    metFORMIN (GLUCOPHAGE) 500 MG tablet  2 times daily with meals     05/29/17 1518       Blane OharaZavitz, Cinda Hara,  MD 05/29/17 1521

## 2017-05-29 NOTE — Discharge Instructions (Signed)
If you were given medicines take as directed.  If you are on coumadin or contraceptives realize their levels and effectiveness is altered by many different medicines.  If you have any reaction (rash, tongues swelling, other) to the medicines stop taking and see a physician.    If your blood pressure was elevated in the ER make sure you follow up for management with a primary doctor or return for chest pain, shortness of breath or stroke symptoms.  Please follow up as directed and return to the ER or see a physician for new or worsening symptoms.  Thank you. Vitals:   05/29/17 1300  BP: (!) 141/81  Pulse: (!) 101  Resp: 18  Temp: 98.2 F (36.8 C)  TempSrc: Oral  SpO2: 97%  Weight: 95.3 kg (210 lb)  Height: 5\' 3"  (1.6 m)

## 2017-05-29 NOTE — ED Triage Notes (Signed)
Patient c/o right flank pain x1 week ago that is progressively getting worse. Denies any pain with urination but states dark urine with odor. Patient states Tuesday fever with diarrhea and nausea. Per patient now diarrhea or fever since but nausea and fatigue. Patient also states she is diabetic and was taking metformin but has not had PCP and has not taken medication in 6 months. Last blood sugar was in 300s.

## 2017-05-31 LAB — URINE CULTURE: CULTURE: NO GROWTH

## 2017-09-25 ENCOUNTER — Encounter (HOSPITAL_COMMUNITY): Payer: Self-pay | Admitting: Emergency Medicine

## 2017-09-25 ENCOUNTER — Other Ambulatory Visit: Payer: Self-pay

## 2017-09-25 ENCOUNTER — Emergency Department (HOSPITAL_COMMUNITY)
Admission: EM | Admit: 2017-09-25 | Discharge: 2017-09-26 | Disposition: A | Discharge status: Home / Self Care | Payer: Self-pay | Attending: Emergency Medicine | Admitting: Emergency Medicine

## 2017-09-25 DIAGNOSIS — Z79899 Other long term (current) drug therapy: Secondary | ICD-10-CM | POA: Insufficient documentation

## 2017-09-25 DIAGNOSIS — R739 Hyperglycemia, unspecified: Secondary | ICD-10-CM

## 2017-09-25 DIAGNOSIS — Z7984 Long term (current) use of oral hypoglycemic drugs: Secondary | ICD-10-CM | POA: Insufficient documentation

## 2017-09-25 DIAGNOSIS — I10 Essential (primary) hypertension: Secondary | ICD-10-CM | POA: Insufficient documentation

## 2017-09-25 DIAGNOSIS — E1165 Type 2 diabetes mellitus with hyperglycemia: Secondary | ICD-10-CM | POA: Insufficient documentation

## 2017-09-25 DIAGNOSIS — F17210 Nicotine dependence, cigarettes, uncomplicated: Secondary | ICD-10-CM | POA: Insufficient documentation

## 2017-09-25 LAB — CBG MONITORING, ED: GLUCOSE-CAPILLARY: 339 mg/dL — AB (ref 65–99)

## 2017-09-25 LAB — CBC
HEMATOCRIT: 46.7 % — AB (ref 36.0–46.0)
Hemoglobin: 16.1 g/dL — ABNORMAL HIGH (ref 12.0–15.0)
MCH: 30.7 pg (ref 26.0–34.0)
MCHC: 34.5 g/dL (ref 30.0–36.0)
MCV: 89 fL (ref 78.0–100.0)
Platelets: 205 10*3/uL (ref 150–400)
RBC: 5.25 MIL/uL — ABNORMAL HIGH (ref 3.87–5.11)
RDW: 12.9 % (ref 11.5–15.5)
WBC: 10.3 10*3/uL (ref 4.0–10.5)

## 2017-09-25 LAB — POC URINE PREG, ED: Preg Test, Ur: NEGATIVE

## 2017-09-25 MED ORDER — SODIUM CHLORIDE 0.9 % IV BOLUS
1000.0000 mL | Freq: Once | INTRAVENOUS | Status: AC
  Administered 2017-09-26: 1000 mL via INTRAVENOUS

## 2017-09-25 NOTE — ED Triage Notes (Signed)
Pt c/o lower back pain, frequent urination, numbness to hand and feet and seeing black spots. Pt has been out of diabetic meds x one week. She states she took her blood sugar at home and her meter read high.

## 2017-09-25 NOTE — ED Provider Notes (Signed)
[facility] EMERGENCY DEPARTMENT Provider Note   CSN: [id number] Arrival date & time: 09/25/17  2215     History   Chief Complaint Chief Complaint  Patient presents with  . Hyperglycemia    HPI Leslie Bates is a 42 y.o. female.  The history is provided by the patient and a relative.  Hyperglycemia  Severity:  Severe Onset quality:  Gradual Duration: Several weeks. Timing:  Constant Progression:  Worsening Chronicity:  New Diabetes status:  Controlled with oral medications Current diabetic therapy:  Metformin Context: noncompliance   Relieved by:  Nothing Ineffective treatments:  None tried Associated symptoms: abdominal pain, blurred vision, dehydration, fatigue, increased thirst, malaise, polyuria and weakness   Associated symptoms: no fever    History of diabetes and hypertension presents with hyperglycemia.  She reports checking her glucose at home and it was higher than the meter could read.  She reports recent fatigue/polyuria/increased thirst.  She reports random intermittent episodes of chest pain, but none at this time.  No abdominal pain at this time.  She reports numbness in her hands and feet at times.   She ran out of her metformin recently. Past Medical History:  Diagnosis Date  . Gestational diabetes   . Hypertension     There are no active problems to display for this patient.   Past Surgical History:  Procedure Laterality Date  . CESAREAN SECTION       OB History    Gravida  5   Para  1   Term  1   Preterm      AB  4   Living  1     SAB  4   TAB      Ectopic      Multiple      Live Births               Home Medications    Prior to Admission medications   Medication Sig Start Date End Date Taking? Authorizing Provider  metFORMIN (GLUCOPHAGE) 500 MG tablet Take 1 tablet (500 mg total) by mouth 2 (two) times daily with a meal. 05/29/17   Blane OharaZavitz, Joshua, MD  ranitidine (ZANTAC) 150 MG tablet Take 1 tablet (150 mg  total) by mouth 2 (two) times daily. 06/16/13   Ivery QualeBryant, Hobson, PA-C    Family History History reviewed. No pertinent family history.  Social History Social History   Tobacco Use  . Smoking status: Current Every Day Smoker    Packs/day: 1.00    Types: Cigarettes  . Smokeless tobacco: Never Used  Substance Use Topics  . Alcohol use: No  . Drug use: No     Allergies   Erythromycin; Morphine and related; and Fentanyl   Review of Systems Review of Systems  Constitutional: Positive for fatigue. Negative for fever.  Eyes: Positive for blurred vision and visual disturbance.  Respiratory: Positive for cough.   Gastrointestinal: Positive for abdominal pain.  Endocrine: Positive for polydipsia and polyuria.  Neurological: Positive for weakness.  All other systems reviewed and are negative.    Physical Exam Updated Vital Signs BP (!) 140/93 (BP Location: Left Arm)   Pulse 98   Temp 98 F (36.7 C)   Resp 18   Ht 1.6 m (5\' 3" )   Wt 99.8 kg (220 lb)   LMP 09/20/2017   SpO2 95%   BMI 38.97 kg/m   Physical Exam CONSTITUTIONAL: Well developed/well nourished HEAD: Normocephalic/atraumatic EYES: EOMI/PERRL ENMT: Mucous membranes dry NECK: supple no  meningeal signs SPINE/BACK:mild tenderness lumbar spine, No bruising/crepitance/stepoffs noted to spine CV: S1/S2 noted, no murmurs/rubs/gallops noted LUNGS: Lungs are clear to auscultation bilaterally, no apparent distress ABDOMEN: soft, nontender, no rebound or guarding, bowel sounds noted throughout abdomen JX:[id number] cva tenderness NEURO: Pt is awake/alert/appropriate, moves all extremitiesx4.  No facial droop.   EXTREMITIES: pulses normal/equal, full ROM SKIN: warm, color normal PSYCH: no abnormalities of mood noted, alert and oriented to situation   ED Treatments / Results  Labs (all labs ordered are listed, but only abnormal results are displayed) Labs Reviewed  BASIC METABOLIC PANEL - Abnormal; Notable for the  following components:      Result Value   Glucose, Bld 328 (*)    All other components within normal limits  CBC - Abnormal; Notable for the following components:   RBC 5.25 (*)    Hemoglobin 16.1 (*)    HCT 46.7 (*)    All other components within normal limits  URINALYSIS, ROUTINE W REFLEX MICROSCOPIC - Abnormal; Notable for the following components:   Specific Gravity, Urine 1.031 (*)    Glucose, UA >=500 (*)    Hgb urine dipstick MODERATE (*)    Bacteria, UA RARE (*)    Squamous Epithelial / LPF 6-30 (*)    All other components within normal limits  CBG MONITORING, ED - Abnormal; Notable for the following components:   Glucose-Capillary 339 (*)    All other components within normal limits  CBG MONITORING, ED - Abnormal; Notable for the following components:   Glucose-Capillary 309 (*)    All other components within normal limits  POC URINE PREG, ED    EKG EKG Interpretation  Date/Time:  Friday September 25 2017 23:34:00 EDT Ventricular Rate:  76 PR Interval:    QRS Duration: 84 QT Interval:  389 QTC Calculation: 438 R Axis:   70 Text Interpretation:  Sinus rhythm Probable left atrial enlargement Probable anteroseptal infarct, old No significant change since last tracing Confirmed by Zadie Rhine ([id number]) on 09/25/2017 11:38:12 PM   Radiology No results found.  Procedures Procedures    Medications Ordered in ED Medications  sodium chloride 0.9 % bolus 1,000 mL (1,000 mLs Intravenous New Bag/Given 09/26/17 0001)     Initial Impression / Assessment and Plan / ED Course  I have reviewed the triage vital signs and the nursing notes.  Pertinent labs   results that were available during my care of the patient were reviewed by me and considered in my medical decision making (see chart for details).     1:14 AM Improved with IV fluids.  Other than hyperglycemia labs reassuring.  She is requesting refills of her medications.  Her blood pressure is still elevated.   2-week course of metformin lisinopril ordered.  She plans to establish with PCP next week.  No signs of DKA.  No signs of sepsis.  Final Clinical Impressions(s) / ED Diagnoses   Final diagnoses:  Hyperglycemia  Essential hypertension    ED Discharge Orders        Ordered    metFORMIN (GLUCOPHAGE) 500 MG tablet  2 times daily with meals     09/26/17 0110    lisinopril (PRINIVIL,ZESTRIL) 20 MG tablet  Daily     09/26/17 Lajoyce Lauber, MD 09/26/17 [phone]

## 2017-09-26 LAB — URINALYSIS, ROUTINE W REFLEX MICROSCOPIC
Bilirubin Urine: NEGATIVE
Glucose, UA: >=500 mg/dL — AB
Ketones, ur: NEGATIVE mg/dL
Leukocytes, UA: NEGATIVE
NITRITE: NEGATIVE
PROTEIN: NEGATIVE mg/dL
SPECIFIC GRAVITY, URINE: 1.031 — AB (ref 1.005–1.030)
pH: 5 (ref 5.0–8.0)

## 2017-09-26 LAB — CBG MONITORING, ED: GLUCOSE-CAPILLARY: 309 mg/dL — AB (ref 65–99)

## 2017-09-26 LAB — BASIC METABOLIC PANEL
Anion gap: 13 (ref 5–15)
BUN: 12 mg/dL (ref 6–20)
CALCIUM: 9 mg/dL (ref 8.9–10.3)
CO2: 22 mmol/L (ref 22–32)
Chloride: 101 mmol/L (ref 101–111)
Creatinine, Ser: 0.59 mg/dL (ref 0.44–1.00)
GFR calc Af Amer: >60 mL/min (ref >60)
GLUCOSE: 328 mg/dL — AB (ref 65–99)
POTASSIUM: 3.8 mmol/L (ref 3.5–5.1)
Sodium: 136 mmol/L (ref 135–145)

## 2017-09-26 MED ORDER — METFORMIN HCL 500 MG PO TABS: 500.0000 mg | ORAL_TABLET | Freq: Two times a day (BID) | ORAL | 0 refills | Status: AC

## 2017-09-26 MED ORDER — LISINOPRIL 20 MG PO TABS: 20.0000 mg | ORAL_TABLET | Freq: Every day | ORAL | 0 refills | Status: AC

## 2018-11-02 IMAGING — CT CT RENAL STONE PROTOCOL
2 of 4 series · 17 of 46 positions shown, 19 images · non-contrast
Comparison: CT abdomen pelvis with contrast 10/15/2016.

CLINICAL DATA: RIGHT flank pain.  Suspected stone disease.

EXAM:
CT ABDOMEN AND PELVIS WITHOUT CONTRAST
TECHNIQUE: Multidetector CT imaging of the abdomen and pelvis was performed
following the standard protocol without IV contrast.

[Series 2: axial st · axial · 0.79mm/px · z∈[+637,+1062]mm · 14 of 93 slices shown, 16 images]
[im 4/93  soft-tissue]
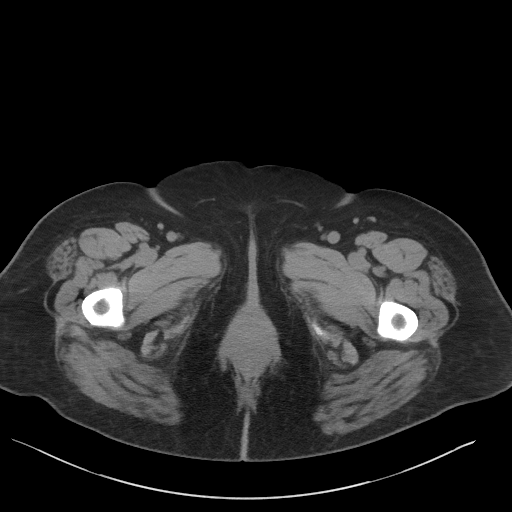
[im 4/93  bone]
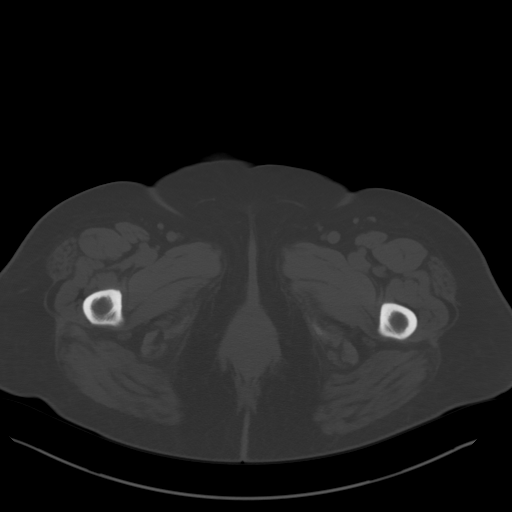
[im 12/93  soft-tissue]
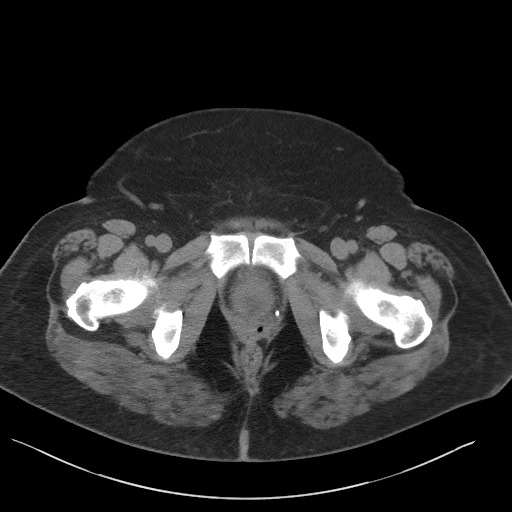
[im 20/93  soft-tissue]
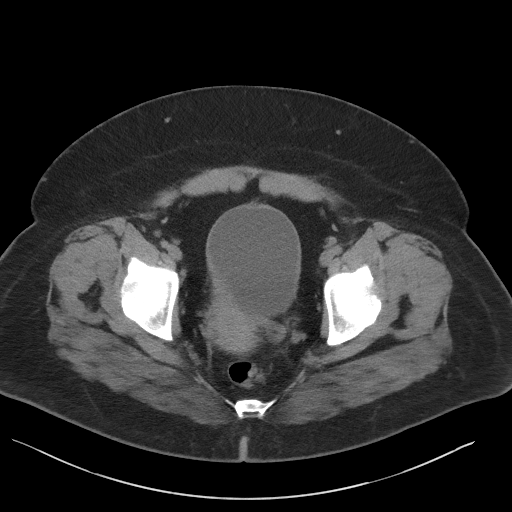
[im 24/93  soft-tissue]
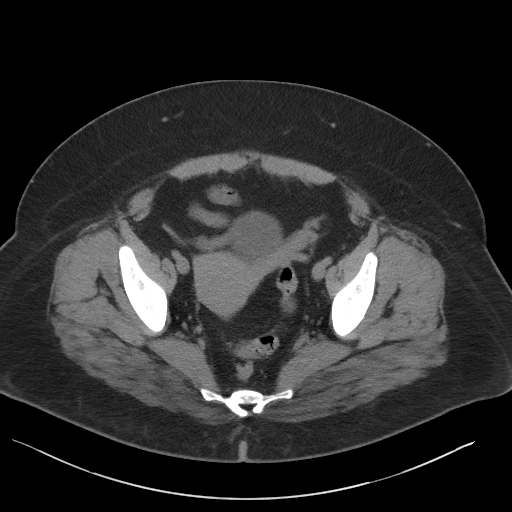
[im 31/93  soft-tissue]
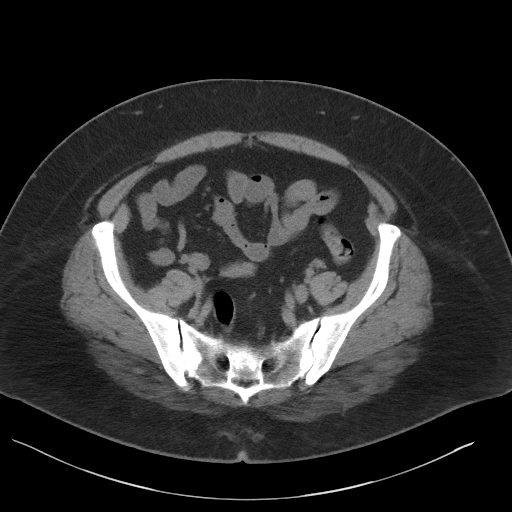
[im 39/93  soft-tissue]
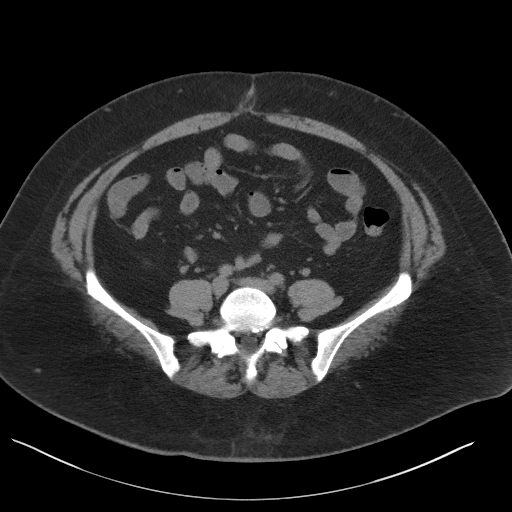
[im 43/93  soft-tissue]
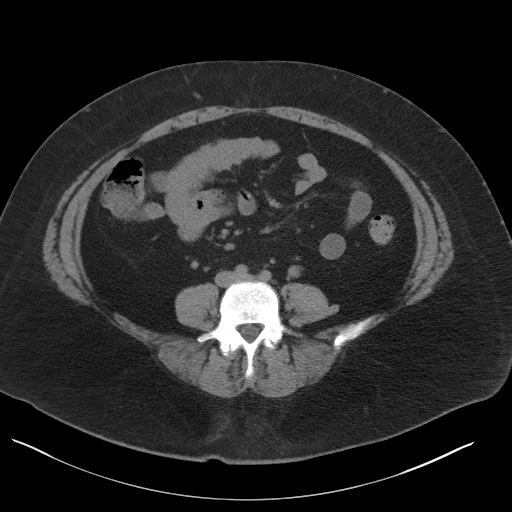
[im 50/93  soft-tissue]
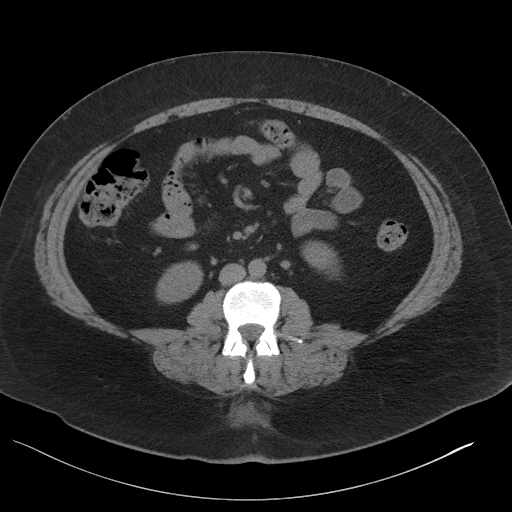
[im 54/93  soft-tissue]
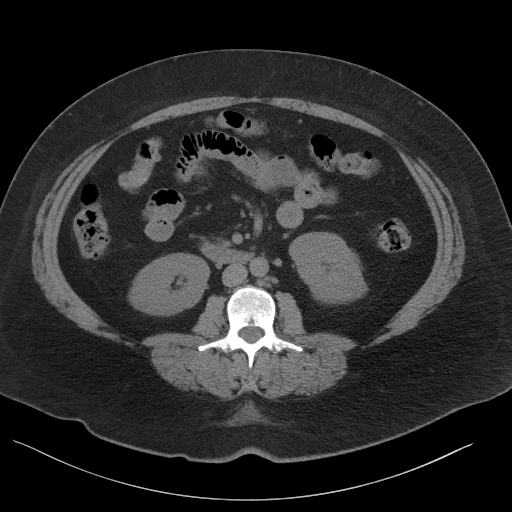
[im 54/93  bone]
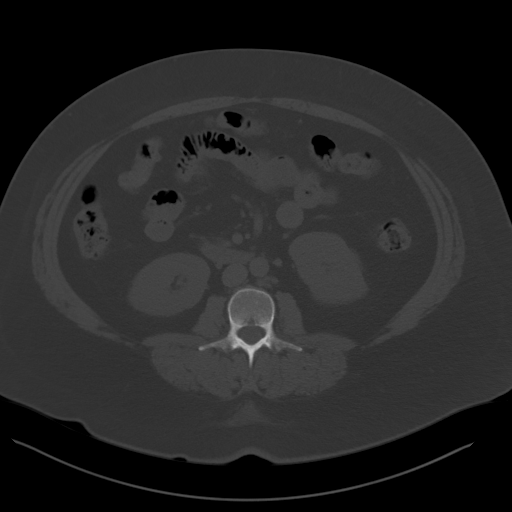
[im 62/93  soft-tissue]
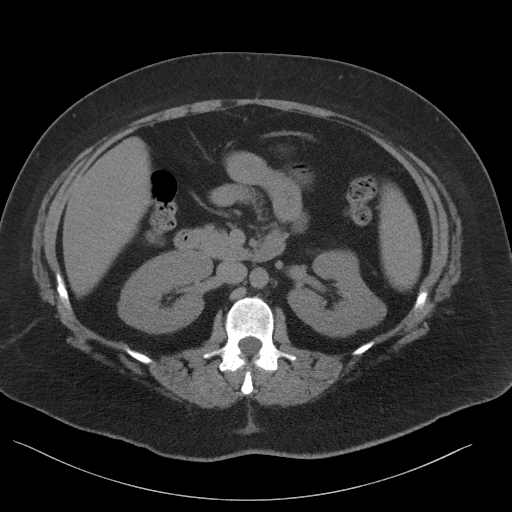
[im 70/93  soft-tissue]
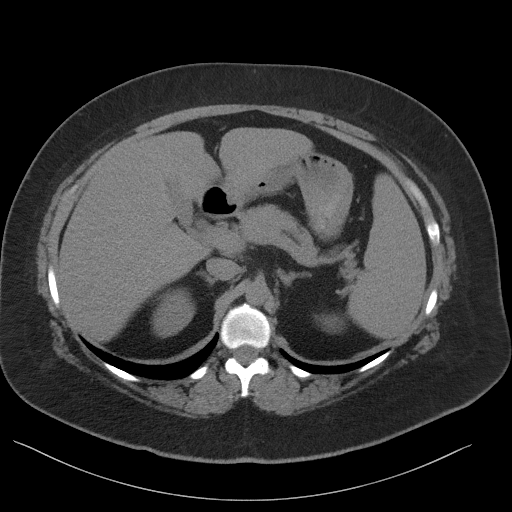
[im 73/93  soft-tissue]
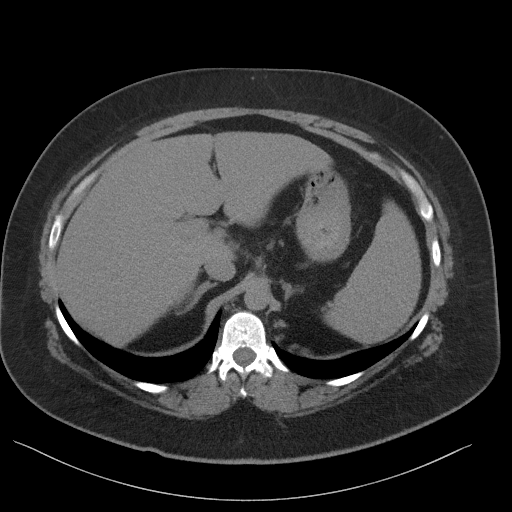
[im 81/93  soft-tissue]
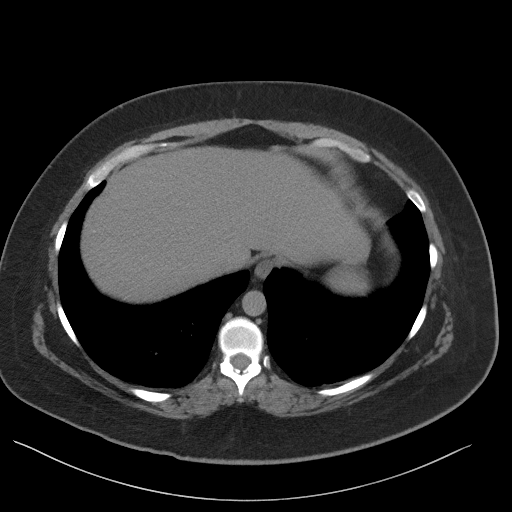
[im 89/93  soft-tissue]
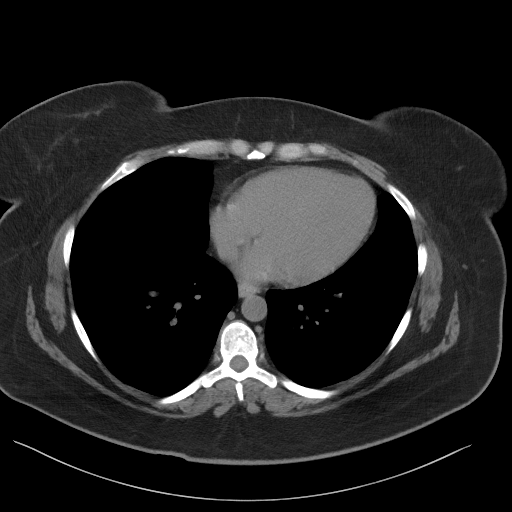

[Series 5: coronal st · coronal · 0.88mm/px · 3 of 101 slices shown]
[im 34/101  soft-tissue]
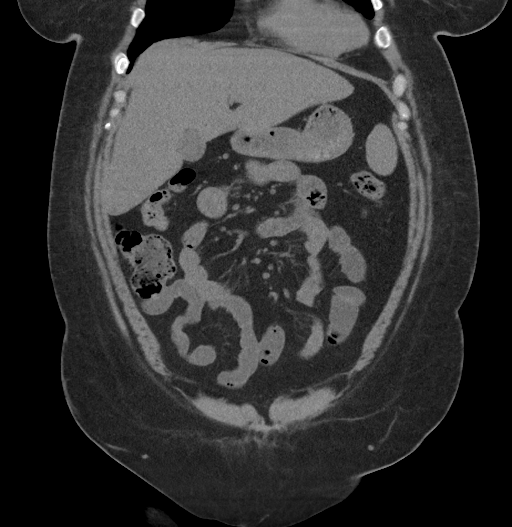
[im 45/101  soft-tissue]
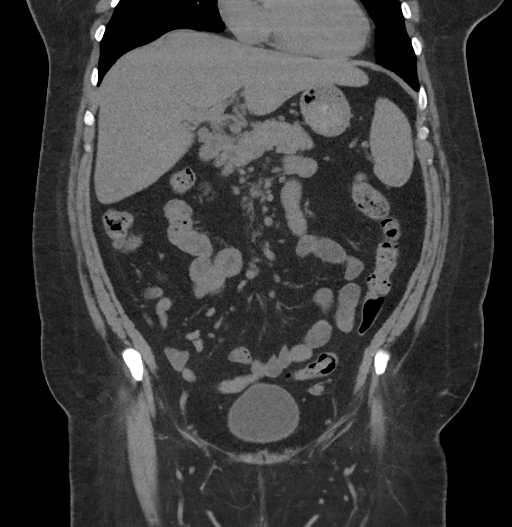
[im 56/101  soft-tissue]
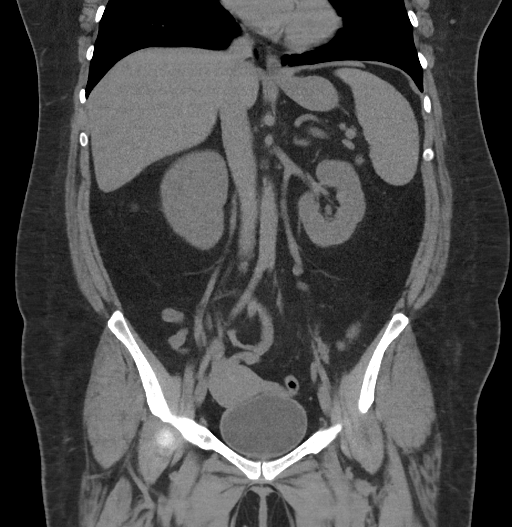

[17 of 46 positions shown; findings below may reference images not displayed]

FINDINGS: Lower chest: No acute abnormality.

Hepatobiliary: No focal liver abnormality is seen. No gallstones,
gallbladder wall thickening, or biliary dilatation.

Pancreas: Unremarkable. No pancreatic ductal dilatation or
surrounding inflammatory changes.

Spleen: Normal in size without focal abnormality.

Adrenals/Urinary Tract: Adrenal glands are unremarkable. Kidneys are
normal, without renal calculi, focal lesion, or hydronephrosis.
Bladder is unremarkable.

Stomach/Bowel: Stomach is within normal limits. Appendix appears
normal. No evidence of bowel wall thickening, distention, or
inflammatory changes.

Vascular/Lymphatic: No significant vascular findings are present. No
enlarged abdominal or pelvic lymph nodes.

Reproductive: Uterus and bilateral adnexa are unremarkable.

Other: No abdominal wall hernia or abnormality. No abdominopelvic
ascites. There is resolution of the previously noted gluteal
inflammation.

Musculoskeletal: No acute or significant osseous findings.
IMPRESSION: No hydronephrosis or renal/ureteral calculi. No cause seen for RIGHT
flank pain.

## 2019-07-26 ENCOUNTER — Encounter: Payer: Self-pay | Admitting: *Deleted

## 2019-07-26 NOTE — Progress Notes (Signed)
Tried to call patient to prescreen her for her [facility] appointment tomorrow.  No answer.  Left message to return my call.

## 2019-07-27 ENCOUNTER — Ambulatory Visit: Payer: Self-pay | Attending: Oncology | Admitting: *Deleted

## 2019-07-27 ENCOUNTER — Encounter: Payer: Self-pay | Admitting: *Deleted

## 2019-07-27 ENCOUNTER — Ambulatory Visit
Admission: RE | Admit: 2019-07-27 | Discharge: 2019-07-27 | Disposition: A | Discharge status: Home / Self Care | Payer: Self-pay | Source: Ambulatory Visit | Attending: Oncology | Admitting: Oncology

## 2019-07-27 ENCOUNTER — Other Ambulatory Visit: Payer: Self-pay

## 2019-07-27 VITALS — BP 128/94 | HR 111 | Temp 96.7°F | Ht 63.0 in | Wt 202.0 lb

## 2019-07-27 DIAGNOSIS — Z0000 Encounter for general adult medical examination without abnormal findings: Secondary | ICD-10-CM

## 2019-07-27 NOTE — Progress Notes (Signed)
  Subjective:     Patient ID: Leslie Bates, female   DOB: 13-Jun-1976, 44 y.o.   MRN: [id number]  HPI   Review of Systems     Objective:   Physical Exam Chest:     Breasts:        Right: No swelling, bleeding, inverted nipple, mass, nipple discharge, skin change or tenderness.        Left: No swelling, bleeding, inverted nipple, mass, nipple discharge, skin change or tenderness.    Lymphadenopathy:     Upper Body:     Right upper body: No supraclavicular or axillary adenopathy.     Left upper body: No supraclavicular or axillary adenopathy.        Assessment:     44 year old White female presents to [facility] for clinical breast exam and baseline mammogram.  She had her last pap at the clinic in [place] and is returning in February for a repeat pap because she states it was unsatisfactory.  On clinical breast exam today there is no dominant mass, nipple discharge, skin changes or lymphadenopathy.  She does have scattered fibroglandular like tissue.  Taught self breast awareness.  Patient has been screened for eligibility.  She does not have any insurance, Medicare or Medicaid.  She also meets financial eligibility.  See Dondra Spry model breast cancer risk assessment below: Risk Assessment    Risk Scores      07/27/2019   Last edited by: Scarlett Presto, RN   5-year risk: 0.8 %   Lifetime risk: 10.8 %            Plan:      Screening mammogram ordered.  Will follow up per [facility] protocol.

## 2019-07-27 NOTE — Patient Instructions (Signed)
Gave patient hand-out, Women Staying Healthy, Active and Well from [facility], with education on breast health, pap smears, heart and colon health. 

## 2019-08-04 ENCOUNTER — Encounter: Payer: Self-pay | Admitting: *Deleted

## 2019-08-04 NOTE — Progress Notes (Signed)
Letter mailed from the [facility] to inform patient of her normal mammogram results.  Patient is to follow-up with annual screening in one year. 

## 2024-05-02 ENCOUNTER — Encounter (INDEPENDENT_AMBULATORY_CARE_PROVIDER_SITE_OTHER): Payer: Self-pay | Admitting: *Deleted

## 2024-05-31 ENCOUNTER — Ambulatory Visit (INDEPENDENT_AMBULATORY_CARE_PROVIDER_SITE_OTHER): Admitting: Gastroenterology

## 2024-08-12 ENCOUNTER — Ambulatory Visit: Admitting: Nurse Practitioner
